# Patient Record
Sex: Female | Born: 1985 | Hispanic: Yes | Smoking: Never smoker
Health system: Southern US, Academic
[De-identification: ages and names within clinical notes are randomized; demographics above are authoritative.]

## PROBLEM LIST (undated history)

## (undated) DIAGNOSIS — A6920 Lyme disease, unspecified: Secondary | ICD-10-CM

## (undated) DIAGNOSIS — G510 Bell's palsy: Secondary | ICD-10-CM

## (undated) DIAGNOSIS — S022XXA Fracture of nasal bones, initial encounter for closed fracture: Secondary | ICD-10-CM

## (undated) DIAGNOSIS — Z8616 Personal history of COVID-19: Secondary | ICD-10-CM

## (undated) DIAGNOSIS — S6291XA Unspecified fracture of right wrist and hand, initial encounter for closed fracture: Secondary | ICD-10-CM

## (undated) DIAGNOSIS — E282 Polycystic ovarian syndrome: Secondary | ICD-10-CM

## (undated) HISTORY — DX: Personal history of COVID-19: Z86.16

## (undated) HISTORY — DX: Bell's palsy: G51.0

## (undated) HISTORY — DX: Lyme disease, unspecified: A69.20

---

## 2020-06-11 ENCOUNTER — Emergency Department (HOSPITAL_COMMUNITY): Payer: BC Managed Care – PPO

## 2020-06-11 ENCOUNTER — Observation Stay
Admission: EM | Admit: 2020-06-11 | Discharge: 2020-06-13 | Disposition: A | Discharge status: Home / Self Care | Payer: BC Managed Care – PPO | Attending: HOSPITALIST | Admitting: HOSPITALIST

## 2020-06-11 ENCOUNTER — Encounter (HOSPITAL_COMMUNITY): Payer: Self-pay

## 2020-06-11 ENCOUNTER — Other Ambulatory Visit: Payer: Self-pay

## 2020-06-11 DIAGNOSIS — R29810 Facial weakness: Secondary | ICD-10-CM

## 2020-06-11 DIAGNOSIS — G510 Bell's palsy: Secondary | ICD-10-CM | POA: Insufficient documentation

## 2020-06-11 DIAGNOSIS — I639 Cerebral infarction, unspecified: Secondary | ICD-10-CM

## 2020-06-11 DIAGNOSIS — G459 Transient cerebral ischemic attack, unspecified: Secondary | ICD-10-CM

## 2020-06-11 DIAGNOSIS — E785 Hyperlipidemia, unspecified: Secondary | ICD-10-CM | POA: Insufficient documentation

## 2020-06-11 DIAGNOSIS — E1165 Type 2 diabetes mellitus with hyperglycemia: Secondary | ICD-10-CM | POA: Insufficient documentation

## 2020-06-11 DIAGNOSIS — A6920 Lyme disease, unspecified: Secondary | ICD-10-CM | POA: Insufficient documentation

## 2020-06-11 DIAGNOSIS — Z79899 Other long term (current) drug therapy: Secondary | ICD-10-CM | POA: Insufficient documentation

## 2020-06-11 DIAGNOSIS — R000 Tachycardia, unspecified: Secondary | ICD-10-CM

## 2020-06-11 DIAGNOSIS — U071 COVID-19: Secondary | ICD-10-CM | POA: Insufficient documentation

## 2020-06-11 DIAGNOSIS — G0490 Encephalitis and encephalomyelitis, unspecified: Principal | ICD-10-CM | POA: Insufficient documentation

## 2020-06-11 HISTORY — DX: Polycystic ovarian syndrome: E28.2

## 2020-06-11 HISTORY — DX: COVID-19: U07.1

## 2020-06-11 HISTORY — DX: Unspecified fracture of right wrist and hand, initial encounter for closed fracture: S62.91XA

## 2020-06-11 HISTORY — DX: Fracture of nasal bones, initial encounter for closed fracture: S02.2XXA

## 2020-06-11 LAB — ECG 12 LEAD
Calculated R Axis: 53 degrees
PR Interval: 150 ms

## 2020-06-11 LAB — PT/INR
INR: 0.99 (ref 0.85–1.10)
PROTHROMBIN TIME: 10.5 seconds (ref 9.0–12.0)

## 2020-06-11 LAB — CBC WITH DIFF
BASOPHIL #: 0.2 10*3/uL (ref 0.00–0.20)
BASOPHIL %: 1 % (ref 0–2)
EOSINOPHIL #: 0.1 10*3/uL (ref 0.00–0.60)
EOSINOPHIL %: 1 % (ref 0–5)
HCT: 39.2 % (ref 36.0–48.0)
HGB: 13 g/dL (ref 11.6–14.8)
LYMPHOCYTE #: 5.7 10*3/uL — ABNORMAL HIGH (ref 1.10–3.80)
LYMPHOCYTE %: 32 % (ref 19–46)
MCH: 29.6 pg (ref 24.4–34.0)
MCHC: 33.1 g/dL (ref 30.0–37.0)
MCV: 89.3 fL — ABNORMAL HIGH (ref 79.0–88.0)
MONOCYTE #: 1.1 10*3/uL — ABNORMAL HIGH (ref 0.10–0.80)
MONOCYTE %: 6 % (ref 4–12)
MPV: 8.2 fL (ref 7.5–11.5)
NEUTROPHIL #: 10.6 10*3/uL — ABNORMAL HIGH (ref 1.80–7.50)
NEUTROPHIL %: 60 % (ref 41–69)
PLATELETS: 458 10*3/uL — ABNORMAL HIGH (ref 130–400)
RBC: 4.39 10*6/uL (ref 3.50–5.50)
RDW: 13.5 % (ref 11.5–14.0)
WBC: 17.8 10*3/uL — ABNORMAL HIGH (ref 4.5–11.5)

## 2020-06-11 LAB — COMPREHENSIVE METABOLIC PANEL, NON-FASTING
ALBUMIN/GLOBULIN RATIO: 1.1 — ABNORMAL LOW (ref 1.5–2.5)
ALBUMIN: 4.7 g/dL (ref 3.5–5.0)
ALKALINE PHOSPHATASE: 129 U/L — ABNORMAL HIGH (ref 38–126)
ALT (SGPT): 43 U/L — ABNORMAL HIGH (ref <35)
ANION GAP: 14 mmol/L (ref 5–19)
AST (SGOT): 38 U/L — ABNORMAL HIGH (ref 14–36)
BILIRUBIN TOTAL: 0.5 mg/dL (ref 0.2–1.3)
BUN/CREA RATIO: 33 — ABNORMAL HIGH (ref 6–20)
BUN: 13 mg/dL (ref 7–17)
CALCIUM: 9.6 mg/dL (ref 8.4–10.2)
CHLORIDE: 101 mmol/L (ref 98–107)
CO2 TOTAL: 23 mmol/L (ref 22–30)
CREATININE: 0.4 mg/dL — ABNORMAL LOW (ref 0.52–1.00)
GLUCOSE: 335 mg/dL — ABNORMAL HIGH (ref 74–106)
POTASSIUM: 3.8 mmol/L (ref 3.5–5.1)
PROTEIN TOTAL: 8.9 g/dL — ABNORMAL HIGH (ref 6.3–8.2)
SODIUM: 138 mmol/L (ref 137–145)

## 2020-06-11 LAB — C-REACTIVE PROTEIN(CRP),INFLAMMATION: CRP INFLAMMATION: 9 mg/L (ref <10.0)

## 2020-06-11 LAB — TROPONIN-I: TROPONIN I: <0.01 ng/mL (ref 0.00–0.03)

## 2020-06-11 LAB — POC BLOOD GLUCOSE (RESULTS): GLUCOSE, POC: 306 mg/dl — ABNORMAL HIGH (ref 74–106)

## 2020-06-11 LAB — HCG QUALITATIVE PREGNANCY, SERUM: PREGNANCY, SERUM QUALITATIVE: NEGATIVE

## 2020-06-11 LAB — SEDIMENTATION RATE: ERYTHROCYTE SEDIMENTATION RATE (ESR): 67 mm/hr — ABNORMAL HIGH (ref 0–15)

## 2020-06-11 MED ORDER — IOPAMIDOL 300 MG IODINE/ML (61 %) INTRAVENOUS SOLUTION
50.0000 mL | INTRAVENOUS | Status: AC
Start: 2020-06-11 — End: 2020-06-11
  Administered 2020-06-11: 23:00:00 50 mL via INTRAVENOUS

## 2020-06-11 MED ORDER — IOPAMIDOL 300 MG IODINE/ML (61 %) INTRAVENOUS SOLUTION
100.0000 mL | INTRAVENOUS | Status: AC
Start: 2020-06-11 — End: 2020-06-11
  Administered 2020-06-11: 100 mL via INTRAVENOUS

## 2020-06-11 NOTE — ED Provider Notes (Addendum)
[name]  ED Primary Provider Note  History of Present Illness   Chief Complaint   Patient presents with   . Facial Droop     Left sided      Arrival: The patient arrived by Car and is accompanied by partner  History Provided by: patient  History Limitations: none    Tammy Pham is a 35 y.o. female who had concerns including Facial Droop.  . 35 year old female with no PMH presents complaining of a 1 day hx of L facial numbness and weakness.  PT reports she first noticed these symptoms while eating dinner yesterday.  Approximately 30 minutes prior to presentation to the ED at approximately 2210 she also developed L arm paresthesias and L leg numbness and weakness.  Denies changes in speech, recent head injury, fevers, chills, vision changes, changes in hearing, or chest pain.  She does note that her L ear was "plugged" last week after which it "popped" and symptoms resolved.  She denies any recent insect bites.  Family hx of Bell's Palsy in grandmother.  Review of Systems   Review of Systems   Constitutional: Negative for chills and fever.   HENT: Negative for ear pain and tinnitus.    Eyes: Negative for visual disturbance.   Cardiovascular: Negative for chest pain.   Musculoskeletal: Negative for gait problem.   Neurological: Positive for facial asymmetry, weakness and numbness. Negative for dizziness, tremors, seizures, syncope, speech difficulty, light-headedness and headaches.        Paresthesias     Historical Data   Below pertinent information reviewed/confirmed with patient/caregiver and/or EMR:  Past Medical History:   Diagnosis Date   . Bell's palsy    . COVID-19 06/11/2020   . History of COVID-19    . Lyme disease    . Nasal fracture    . PCOS (polycystic ovarian syndrome)    . Right hand fracture     5th metacarpal fracture     Medications Prior to Admission     None        No Known Allergies  History reviewed. No pertinent surgical history.  Family Medical History:     Problem  Relation (Age of Onset)    Diabetes Mother, Maternal Grandmother        Social History     Tobacco Use   . Smoking status: Never Smoker   . Smokeless tobacco: Never Used   Vaping Use   . Vaping Use: Never used   Substance Use Topics   . Alcohol use: Yes     Comment: occasional   . Drug use: Never     Physical Exam   ED Triage Vitals [06/11/20 2249]   BP (Non-Invasive) (!) 156/106   Heart Rate (!) 109   Respiratory Rate 16   Temperature 37 C (98.6 F)   SpO2 99 %   Weight 86.2 kg (190 lb)   Height 1.549 m (5' 1" )     Physical Exam   Constitutional: 35 y.o. female appears  stated age in good health. Resting in bed in no acute distress  HENT:   Head: Normocephalic and atraumatic.   Mouth/Throat: Oropharynx is clear and moist.   Eyes: EOMI, PERRL , conjunctivae without discharge bilaterally  Neck: Trachea midline. Neck supple.  Cardiovascular: Regular rate and rhythm. No murmurs, rubs or gallops. Intact distal pulses.  Pulmonary/Chest: Breath sounds equal bilaterally. No respiratory distress. No wheezes, rales, or chest tenderness.   Abdominal: BS +.  Abdomen soft, nontender, no rebound or guarding.  Musculoskeletal: No edema, tenderness or deformity.  Skin: Warm and dry. No rash, erythema, pallor or cyanosis  Psychiatric: Normal mood and affect. Behavior is normal.   Neurological: Patient alert and responsive, Subjective decreased sensation L cheek and L leg, L sided facial droop that is not forehead sparing, L upper hemianopia, NIH 5    Patient Data   Labs Reviewed   SEDIMENTATION RATE - Abnormal; Notable for the following components:       Result Value    ERYTHROCYTE SEDIMENTATION RATE (ESR) 67 (*)     All other components within normal limits   COMPREHENSIVE METABOLIC PANEL, NON-FASTING - Abnormal; Notable for the following components:    CREATININE 0.40 (*)     BUN/CREA RATIO 33 (*)     GLUCOSE 335 (*)     ALKALINE PHOSPHATASE 129 (*)     ALT (SGPT) 43 (*)     AST (SGOT) 38 (*)     PROTEIN TOTAL 8.9 (*)      ALBUMIN/GLOBULIN RATIO 1.1 (*)     All other components within normal limits    Narrative:     HEM:29,ICT:<2,TURB:<20  Estimated Glomerular Filtration Rate (eGFR) calculated using the CKD-EPI (2009) equation, intended for patients 3 years of age and older. If race and/or gender is not documented or "unknown," there will be no eGFR calculation.   CBC WITH DIFF - Abnormal; Notable for the following components:    WBC 17.8 (*)     MCV 89.3 (*)     PLATELETS 458 (*)     NEUTROPHIL # 10.60 (*)     LYMPHOCYTE # 5.70 (*)     MONOCYTE # 1.10 (*)     All other components within normal limits   COVID-19 [place] MOLECULAR LAB TESTING - Abnormal; Notable for the following components:    SARS-CoV-2 Detected (*)     All other components within normal limits    Narrative:     Results are for the qualitative identification of SARS-CoV-2 (formerly 2019-nCoV) RNA. The SARS-CoV-2 RNA is generally detectable in nasopharyngeal swabs and nasal wash/aspirates during the ACUTE PHASE of infection. Hence, this test is intended to be performed on respiratory specimens collected from individuals who meet Centers for Disease Control and Prevention (CDC) clinical and/or epidemiological criteria for Coronavirus Disease 2019 (COVID-19) testing. CDC COVID-19 criteria for testing on human specimens are available at [facility] webpage information for Healthcare Professionals: Coronavirus Disease 2019 (COVID-19) ([email or web address]).    Disclaimer:  This assay has been authorized by FDA under an Emergency Use Authorization for use in laboratories certified under the Clinical Laboratory Improvement Amendments of 1988 (CLIA), 42 U.S.C. [phone], to perform high complexity tests. The impacts of vaccines, antiviral therapeutics, antibiotics, chemotherapeutic or immunosuppressant drugs have not been evaluated.    Test methodology:   Cepheid Xpert Xpress SARS-CoV-2 Assay real-time polymerase chain reaction (RT-PCR) test on the  GeneXpert Dx system.   POC BLOOD GLUCOSE (RESULTS) - Abnormal; Notable for the following components:    GLUCOSE, POC 306 (*)     All other components within normal limits   C-REACTIVE PROTEIN(CRP),INFLAMMATION - Normal    Narrative:     HEM:29,ICT:<2,TURB:<20   TROPONIN-I - Normal    Narrative:     HEM:29,ICT:<2,TURB:<20   PT/INR - Normal   CBC/DIFF    Narrative:     The following orders were created for panel order CBC/DIFF.  Procedure  Abnormality         Status                     ---------                               -----------         ------                     CBC WITH DIFF[[id number]]                Abnormal            Final result                 Please view results for these tests on the individual orders.   HCG QUALITATIVE PREGNANCY, SERUM   TYPE AND SCREEN     Imaging:  CT ANGIO INTRA-EXTRA CRANIAL W IV CONTRAST   Final Result by Edi, Radresults In (05/23 0020)   NO DISEASE OR STENOSIS INVOLVING THE CCAS, BIFURCATIONS OR ICAS BILATERALLY      VERTEBRAL ARTERIES ARE CODOMINANT WITHOUT ANY DISEASE OR STENOSIS      NO INTRACRANIAL VASCULAR IRREGULARITY, STENOSIS, THROMBUS OR OTHER ABNORMALITY                  One or more dose reduction techniques were used (e.g., Automated exposure control, adjustment of the mA and/or kV according to patient size, use of iterative reconstruction technique).         Radiologist location ID: [id number]         CT STROKE PERFUSION ONLY   Final Result by Edi, Radresults In (05/22 2331)   UNREMARKABLE EXAM                  One or more dose reduction techniques were used (e.g., Automated exposure control, adjustment of the mA and/or kV according to patient size, use of iterative reconstruction technique).         Radiologist location ID: [id number]         CT BRAIN WO IV CONTRAST   Final Result by Edi, Radresults In (05/22 2329)   NO ACUTE ABNORMALITY               One or more dose reduction techniques were used (e.g., Automated exposure control,  adjustment of the mA and/or kV according to patient size, use of iterative reconstruction technique).         Radiologist location ID: [id number]           EKG: Reviewed, see CVIS tests for interpretation.  Medical Decision Making   MDM/Course:  Meliah Appleman is a 35 y.o. female who had concerns including Facial Droop.   Given patients history, current symptoms, and exam; concern for, but not limited to, CVA, bell's palsy, vasculitis, demyelinating disorder.   Patient has a medium likelihood of decompensation/complication/mortality/morbidity due to their current condition.   Relevant/pertinent previous medical records reviewed via chart review activity and/or CareEverywhere activity.   LKW 2210, NIH 5.  BG on arrival elevated.  CT head and CT perfusion/CTA head and neck were performed upon arrival to the ED after which telestroke was contacted - physician recommended continuing with work up and close observation as he suspects a demyelinating disorder or vasculitis rather than CVA as the etiology of the patient's neurologic symptoms.  No findings of acute significance on imaging.  Labs remarkable for leukocytosis, thrombocytosis, and significant elevation in ESR.  COVID positive.   Telestroke updated and recommended waiting on steroids until MRI obtained.  Discussed results with the patient and recommended hospital admission for inpatient neurologic work up due to concern for COVID related encephalitis or vasculitis.  Pt understands and agrees to treatment plan.  Case was discussed with hospitalist who was agreeable to admitting.  Pt was admitted to the hospital in stable condition.         Medications Administered in the ED   iopamidol (ISOVUE-300) 61% infusion (50 mL Intravenous Given 06/11/20 2314)   iopamidol (ISOVUE-300) 61% infusion (100 mL Intravenous Given 06/11/20 2333)     Encounter Diagnoses   Name Primary?   . CVA (cerebral vascular accident) (CMS [place])    . TIA (transient ischemic attack)      Patient  will be admitted to the Medicine service for further workup and management.    Disposition: Admitted             Parts of this patients chart may have been completed in a retrospective fashion due to simultaneous direct patient care activities in the Emergency Department.   This note may have been partially generated using MModal Fluency Direct dictation system, and there may be some incorrect words, spellings, and/or punctuation that were not noted in checking the note before saving.      Dionne Ano, DO    I personally saw and examined the patient. I agree with the plan of care as documented. See the Fellow's note for full details.   Aundra Dubin, MD

## 2020-06-12 ENCOUNTER — Inpatient Hospital Stay (HOSPITAL_COMMUNITY): Payer: BC Managed Care – PPO

## 2020-06-12 ENCOUNTER — Encounter (HOSPITAL_COMMUNITY): Payer: Self-pay | Admitting: Family Medicine

## 2020-06-12 DIAGNOSIS — E119 Type 2 diabetes mellitus without complications: Secondary | ICD-10-CM

## 2020-06-12 DIAGNOSIS — R200 Anesthesia of skin: Secondary | ICD-10-CM

## 2020-06-12 DIAGNOSIS — R29810 Facial weakness: Secondary | ICD-10-CM

## 2020-06-12 DIAGNOSIS — E669 Obesity, unspecified: Secondary | ICD-10-CM

## 2020-06-12 DIAGNOSIS — U071 COVID-19: Secondary | ICD-10-CM

## 2020-06-12 DIAGNOSIS — R7401 Elevation of levels of liver transaminase levels: Secondary | ICD-10-CM

## 2020-06-12 DIAGNOSIS — D72829 Elevated white blood cell count, unspecified: Secondary | ICD-10-CM

## 2020-06-12 DIAGNOSIS — G0490 Encephalitis and encephalomyelitis, unspecified: Secondary | ICD-10-CM | POA: Diagnosis present

## 2020-06-12 DIAGNOSIS — R030 Elevated blood-pressure reading, without diagnosis of hypertension: Secondary | ICD-10-CM

## 2020-06-12 DIAGNOSIS — Z794 Long term (current) use of insulin: Secondary | ICD-10-CM

## 2020-06-12 LAB — ECG 12 LEAD
Calculated P Axis: 60 degrees
Calculated T Axis: 37 degrees
QRS Duration: 82 ms
QT Interval: 324 ms
QTC Calculation: 383 ms
Ventricular rate: 102 {beats}/min

## 2020-06-12 LAB — DRUG SCREEN, WITH CONFIRMATION, URINE
AMPHETAMINES URINE: NEGATIVE
BARBITURATES URINE: NEGATIVE
BENZODIAZEPINES URINE: NEGATIVE
BUPRENORPHINE URINE: NEGATIVE
CANNABINOIDS URINE: NEGATIVE
COCAINE METABOLITES URINE: NEGATIVE
FENTANYL, RANDOM URINE: NEGATIVE
METHADONE URINE: NEGATIVE
OPIATES URINE (LOW CUTOFF): NEGATIVE
OXYCODONE URINE: NEGATIVE
PCP URINE: NEGATIVE

## 2020-06-12 LAB — LIPID PANEL
CHOLESTEROL: 196 mg/dL (ref 0–200)
HDL CHOL: 37 mg/dL — ABNORMAL LOW (ref >40)
LDL CALC: 120 mg/dL — ABNORMAL HIGH (ref <100)
TRIGLYCERIDES: 194 mg/dL — ABNORMAL HIGH (ref <150)

## 2020-06-12 LAB — POC BLOOD GLUCOSE (RESULTS)
GLUCOSE, POC: 285 mg/dl — ABNORMAL HIGH (ref 74–106)
GLUCOSE, POC: 386 mg/dl — ABNORMAL HIGH (ref 74–106)
GLUCOSE, POC: 339 mg/dl — ABNORMAL HIGH (ref 74–106)
GLUCOSE, POC: 226 mg/dl — ABNORMAL HIGH (ref 74–106)

## 2020-06-12 LAB — COVID-19 ~~LOC~~ MOLECULAR LAB TESTING: SARS-CoV-2: DETECTED — AB

## 2020-06-12 LAB — LAVENDER TOP TUBE

## 2020-06-12 LAB — TYPE AND SCREEN
ABO/RH(D): O POS
ANTIBODY SCREEN: NEGATIVE

## 2020-06-12 LAB — HGA1C (HEMOGLOBIN A1C WITH EST AVG GLUCOSE)
ESTIMATED AVERAGE GLUCOSE: 229 mg/dL
HEMOGLOBIN A1C: 9.6 % — ABNORMAL HIGH (ref 4.0–6.0)

## 2020-06-12 LAB — MAGNESIUM: MAGNESIUM: 1.8 mg/dL (ref 1.6–2.3)

## 2020-06-12 LAB — PROCALCITONIN: PROCALCITONIN: 0.05 ng/mL (ref 0.00–0.08)

## 2020-06-12 LAB — BLUE TOP TUBE

## 2020-06-12 LAB — LIGHT GREEN TOP TUBE

## 2020-06-12 MED ORDER — INSULIN NPH ISOPHANE U-100 HUMAN 100 UNIT/ML SUBCUTANEOUS SUSP - CHARGE BY DOSE
8.0000 [IU] | Freq: Once | SUBCUTANEOUS | Status: AC
Start: 2020-06-12 — End: 2020-06-12
  Administered 2020-06-12: 8 [IU] via SUBCUTANEOUS
  Filled 2020-06-12: qty 8

## 2020-06-12 MED ORDER — GADOTERATE MEGLUMINE 0.5 MMOL/ML (376.9 MG/ML) INTRAVENOUS SOLUTION
20.0000 mL | INTRAVENOUS | Status: AC
Start: 2020-06-12 — End: 2020-06-12
  Administered 2020-06-12: 18 mL via INTRAVENOUS

## 2020-06-12 MED ORDER — SODIUM CHLORIDE 0.9 % (FLUSH) INJECTION SYRINGE
10.0000 mL | INJECTION | INTRAMUSCULAR | Status: DC | PRN
Start: 2020-06-12 — End: 2020-06-13

## 2020-06-12 MED ORDER — INSULIN LISPRO 100 UNIT/ML SUB-Q - CHARGE BY DOSE
10.0000 [IU] | Freq: Three times a day (TID) | SUBCUTANEOUS | Status: DC
Start: 2020-06-12 — End: 2020-06-13
  Administered 2020-06-12 – 2020-06-13 (×4): 10 [IU] via SUBCUTANEOUS
  Filled 2020-06-12 (×2): qty 1

## 2020-06-12 MED ORDER — INSULIN NPH ISOPHANE U-100 HUMAN 100 UNIT/ML SUBCUTANEOUS SUSP - CHARGE BY DOSE
8.0000 [IU] | Freq: Once | SUBCUTANEOUS | Status: DC
Start: 2020-06-12 — End: 2020-06-12

## 2020-06-12 MED ORDER — LISINOPRIL 10 MG TABLET
10.0000 mg | ORAL_TABLET | Freq: Every day | ORAL | Status: DC
Start: 2020-06-12 — End: 2020-06-13
  Administered 2020-06-12: 10 mg via ORAL
  Administered 2020-06-13: 0 mg via ORAL
  Filled 2020-06-12: qty 1

## 2020-06-12 MED ORDER — ATORVASTATIN 20 MG TABLET
20.0000 mg | ORAL_TABLET | Freq: Every evening | ORAL | Status: DC
Start: 2020-06-12 — End: 2020-06-13
  Administered 2020-06-12 (×2): 20 mg via ORAL
  Filled 2020-06-12: qty 1

## 2020-06-12 MED ORDER — DIPHENHYDRAMINE 50 MG/ML INJECTION SOLUTION
25.0000 mg | Freq: Once | INTRAMUSCULAR | Status: AC
Start: 2020-06-12 — End: 2020-06-12
  Administered 2020-06-12: 25 mg via INTRAVENOUS
  Filled 2020-06-12: qty 1

## 2020-06-12 MED ORDER — DOCUSATE SODIUM 100 MG CAPSULE
100.0000 mg | ORAL_CAPSULE | Freq: Two times a day (BID) | ORAL | Status: DC | PRN
Start: 2020-06-12 — End: 2020-06-13

## 2020-06-12 MED ORDER — LACTATED RINGERS INTRAVENOUS SOLUTION
INTRAVENOUS | Status: AC
Start: 2020-06-12 — End: 2020-06-13

## 2020-06-12 MED ORDER — LORAZEPAM 2 MG/ML INJECTION SOLUTION
0.5000 mg | Freq: Once | INTRAMUSCULAR | Status: DC
Start: 2020-06-12 — End: 2020-06-12

## 2020-06-12 MED ORDER — SODIUM CHLORIDE 0.9 % (FLUSH) INJECTION SYRINGE
10.0000 mL | INJECTION | Freq: Three times a day (TID) | INTRAMUSCULAR | Status: DC
Start: 2020-06-12 — End: 2020-06-13
  Administered 2020-06-12 (×2): 10 mL
  Administered 2020-06-12 – 2020-06-13 (×2): 0 mL

## 2020-06-12 MED ORDER — INSULIN LISPRO 100 UNIT/ML SUB-Q SSIP
0.0000 [IU] | INJECTION | Freq: Four times a day (QID) | SUBCUTANEOUS | Status: DC
Start: 2020-06-12 — End: 2020-06-13
  Administered 2020-06-12 (×2): 6 [IU] via SUBCUTANEOUS
  Administered 2020-06-12: 4 [IU] via SUBCUTANEOUS
  Administered 2020-06-12: 6 [IU] via SUBCUTANEOUS
  Administered 2020-06-13: 4 [IU] via SUBCUTANEOUS
  Administered 2020-06-13: 0 [IU] via SUBCUTANEOUS
  Filled 2020-06-12 (×2): qty 12

## 2020-06-12 MED ORDER — ONDANSETRON HCL (PF) 4 MG/2 ML INJECTION SOLUTION
4.0000 mg | Freq: Four times a day (QID) | INTRAMUSCULAR | Status: DC | PRN
Start: 2020-06-12 — End: 2020-06-13

## 2020-06-12 MED ORDER — PREDNISONE 20 MG TABLET
50.0000 mg | ORAL_TABLET | Freq: Every morning | ORAL | Status: DC
Start: 2020-06-13 — End: 2020-06-13
  Administered 2020-06-13: 50 mg via ORAL
  Filled 2020-06-12: qty 2

## 2020-06-12 MED ORDER — INSULIN GLARGINE-YFGN (U-100) 100 UNIT/ML SUBQ - CHARGE BY DOSE
10.0000 [IU] | Freq: Every day | SUBCUTANEOUS | Status: DC
Start: 2020-06-12 — End: 2020-06-12
  Administered 2020-06-12: 10 [IU] via SUBCUTANEOUS
  Filled 2020-06-12: qty 10

## 2020-06-12 MED ORDER — PREDNISONE 20 MG TABLET
60.0000 mg | ORAL_TABLET | Freq: Every morning | ORAL | Status: DC
Start: 2020-06-13 — End: 2020-06-12

## 2020-06-12 MED ORDER — METFORMIN ER 500 MG TABLET,EXTENDED RELEASE 24 HR
1000.0000 mg | ORAL_TABLET | Freq: Every evening | ORAL | Status: DC
Start: 2020-06-12 — End: 2020-06-13
  Administered 2020-06-12: 1000 mg via ORAL
  Filled 2020-06-12: qty 2

## 2020-06-12 MED ORDER — METHYLPREDNISOLONE SOD SUCCINATE 40 MG/ML SOLUTION FOR INJ. WRAPPER
40.0000 mg | Freq: Two times a day (BID) | INTRAMUSCULAR | Status: DC
Start: 2020-06-12 — End: 2020-06-12
  Administered 2020-06-12 (×2): 40 mg via INTRAVENOUS
  Filled 2020-06-12: qty 1

## 2020-06-12 MED ORDER — INSULIN GLARGINE-YFGN (U-100) 100 UNIT/ML SUBQ - CHARGE BY DOSE
20.0000 [IU] | Freq: Every evening | SUBCUTANEOUS | Status: DC
Start: 2020-06-13 — End: 2020-06-13

## 2020-06-12 MED ORDER — ACETAMINOPHEN 325 MG TABLET
650.0000 mg | ORAL_TABLET | ORAL | Status: DC | PRN
Start: 2020-06-12 — End: 2020-06-13
  Administered 2020-06-13: 650 mg via ORAL
  Filled 2020-06-12: qty 2

## 2020-06-12 MED ORDER — VALACYCLOVIR 500 MG TABLET
1000.0000 mg | ORAL_TABLET | Freq: Every day | ORAL | Status: DC
Start: 2020-06-12 — End: 2020-06-13
  Administered 2020-06-12 – 2020-06-13 (×3): 1000 mg via ORAL
  Filled 2020-06-12 (×2): qty 2

## 2020-06-12 NOTE — Nurses Notes (Signed)
Pt rang out to report that she is getting hives on her cheeks and has a tingling sensation in her face. Dr. Lawson Fiscal made aware that pt just received her first dose of Solumedrol which could be the cause. Benedryl 25 mg IV x 1 ordered.

## 2020-06-12 NOTE — H&P (Addendum)
[facility]   History and Physical      CHIEF COMPLAINT: facial droop and numbness    HISTORY OF PRESENT ILLNESS:  35 y.o. female with medical history consisting of, but not limited to, PCOS who recently moved here from [place] temporarily, c/oleft facial numbness. Pt states about a week ago while in [place] she was in the shower when she started experiencing significant hearing loss in the left eye after " her ear drum popped". She has since regained her hearing in that ear. On Friday she woke up with headaches that also went away. On Saturday Pt states she was drinking out of a straw, but could not form a proper seal to allow suctioning, later that day she couldn't feel her mouth, couldn't chew properly, and drooled a bit. She looked at herself in the mirror and noticed lower facial asymmetry. She could not close her left eye. She had some difficulty seeing on the left side of her lateral visual field. Yesterday she also noticed mild numbness in the left upper extremity.    Denies fever, chills, dyspnea, rash, discolored urine, or other motor or sensory symptoms.    Teleneurology from [facility] was consulted and they did not feel that it was a stroke or Bell's palsy. Instead they thought it could be due to a vasculitis of some form. They recommended CVA workup anyway.    PMHx:    Past Medical History:   Diagnosis Date   . Nasal fracture    . PCOS (polycystic ovarian syndrome)    . Right hand fracture     5th metacarpal fracture     PSHx:   none   Allergies:    No Known Allergies Social History  Social History     Tobacco Use   . Smoking status: Never Smoker   . Smokeless tobacco: Never Used   Vaping Use   . Vaping Use: Never used   Substance Use Topics   . Drug use: Never       Family History  Family Medical History:     Problem Relation (Age of Onset)    Diabetes Mother, Maternal Grandmother               REVIEW OF SYSTEMS:  All systems were reviewed with patient and found to be negative, except for what has  been showed in the HPI.    MEDICATIONS:  No current outpatient medications    Medications Administered in the ED   iopamidol (ISOVUE-300) 61% infusion (50 mL Intravenous Given 06/11/20 2314)   iopamidol (ISOVUE-300) 61% infusion (100 mL Intravenous Given 06/11/20 2333)       PHYSICAL EXAMINATION:  Vital signs reviewed.  Gen : Not in acute distress.obese  Heent: NC, AT.   Neck: supple, No JVD.  No carotid bruit.  Heart : S1 S2 auscultated, mild tachycardia 106 to 110. no murmur  Lungs: No ronchi, rales, wheezes, or crackles. On room air.  Abdomen: soft, non distended, nontender, normoactive bowels  Neuro: AAO X 3. No gross motor deficit. PERRLA, EOMI. I did not detect any visual file deficit. 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 Pt could not fully close left eye. No winkled noted on left forehead with eyebrows raising.. mild left UE paresthesia. No nystagmus, no babinski. Normal cerebellar function testing. Weaker left hand grip ( pt states that's chronic since a right hand fracture a while ago).  Musculoskeletal: FROM, no chest wall tenderness. No homan's.  EXT: no clubbing, or cyanosis .  No edema .    Skin: no rash. no jaundice. no pallor  Psych: normal affect, congruent mood.    Labs, Imaging studies, and other diagnostic data reviewed.  Results for orders placed or performed during the hospital encounter of 06/11/20 (from the past 24 hour(s))   ECG 12 LEAD   Result Value Ref Range    Ventricular rate 102 BPM    PR Interval 150 ms    QRS Duration 82 ms    QT Interval 324 ms    QTC Calculation 383 ms    Calculated P Axis 60 degrees    Calculated R Axis 53 degrees    Calculated T Axis 37 degrees   POC BLOOD GLUCOSE (RESULTS)   Result Value Ref Range    GLUCOSE, POC 306 (H) 74 - 106 mg/dl   SEDIMENTATION RATE   Result Value Ref Range    ERYTHROCYTE SEDIMENTATION RATE (ESR) 67 (H) 0 - 15 mm/hr   C-REACTIVE PROTEIN(CRP),INFLAMMATION   Result Value Ref Range    CRP INFLAMMATION 9.0 <10.0 mg/L   COMPREHENSIVE  METABOLIC PANEL, NON-FASTING   Result Value Ref Range    SODIUM 138 137 - 145 mmol/L    POTASSIUM 3.8 3.5 - 5.1 mmol/L    CHLORIDE 101 98 - 107 mmol/L    CO2 TOTAL 23 22 - 30 mmol/L    ANION GAP 14 5 - 19 mmol/L    BUN 13 7 - 17 mg/dL    CREATININE 0.40 (L) 0.52 - 1.00 mg/dL    BUN/CREA RATIO 33 (H) 6 - 20    ESTIMATED GFR      ALBUMIN 4.7 3.5 - 5.0 g/dL    CALCIUM 9.6 8.4 - 10.2 mg/dL    GLUCOSE 335 (H) 74 - 106 mg/dL    ALKALINE PHOSPHATASE 129 (H) 38 - 126 U/L    ALT (SGPT) 43 (H) <35 U/L    AST (SGOT) 38 (H) 14 - 36 U/L    BILIRUBIN TOTAL 0.5 0.2 - 1.3 mg/dL    PROTEIN TOTAL 8.9 (H) 6.3 - 8.2 g/dL    ALBUMIN/GLOBULIN RATIO 1.1 (L) 1.5 - 2.5   HCG QUALITATIVE PREGNANCY, SERUM   Result Value Ref Range    PREGNANCY, SERUM QUALITATIVE Negative    TROPONIN-I   Result Value Ref Range    TROPONIN I <0.01 0.00 - 0.03 ng/mL   CBC WITH DIFF   Result Value Ref Range    WBC 17.8 (H) 4.5 - 11.5 x10^3/uL    RBC 4.39 3.50 - 5.50 x10^6/uL    HGB 13.0 11.6 - 14.8 g/dL    HCT 39.2 36.0 - 48.0 %    MCV 89.3 (H) 79.0 - 88.0 fL    MCH 29.6 24.4 - 34.0 pg    MCHC 33.1 30.0 - 37.0 g/dL    RDW 13.5 11.5 - 14.0 %    PLATELETS 458 (H) 130 - 400 x10^3/uL    MPV 8.2 7.5 - 11.5 fL    NEUTROPHIL % 60 41 - 69 %    LYMPHOCYTE % 32 19 - 46 %    MONOCYTE % 6 4 - 12 %    EOSINOPHIL % 1 0 - 5 %    BASOPHIL % 1 0 - 2 %    NEUTROPHIL # 10.60 (H) 1.80 - 7.50 x10^3/uL    LYMPHOCYTE # 5.70 (H) 1.10 - 3.80 x10^3/uL    MONOCYTE # 1.10 (H) 0.10 - 0.80 x10^3/uL    EOSINOPHIL # 0.10  0.00 - 0.60 x10^3/uL    BASOPHIL # 0.20 0.00 - 0.20 x10^3/uL   PT/INR   Result Value Ref Range    PROTHROMBIN TIME 10.5 9.0 - 12.0 seconds    INR 0.99 0.85 - 1.10   TYPE AND SCREEN   Result Value Ref Range    ABO/RH(D) O Rh Positive     ANTIBODY SCREEN Negative    COVID-19 - SCREENING - Admission (NON-PUI)   Result Value Ref Range    SARS-CoV-2 Detected (A) Not Detected     CT ANGIO INTRA-EXTRA CRANIAL W IV CONTRAST   Final Result by Edi, Radresults In (05/23 0020)   NO DISEASE  OR STENOSIS INVOLVING THE CCAS, BIFURCATIONS OR ICAS BILATERALLY      VERTEBRAL ARTERIES ARE CODOMINANT WITHOUT ANY DISEASE OR STENOSIS      NO INTRACRANIAL VASCULAR IRREGULARITY, STENOSIS, THROMBUS OR OTHER ABNORMALITY                  One or more dose reduction techniques were used (e.g., Automated exposure control, adjustment of the mA and/or kV according to patient size, use of iterative reconstruction technique).         Radiologist location ID: [id number]         CT STROKE PERFUSION ONLY   Final Result by Edi, Radresults In (05/22 2331)   UNREMARKABLE EXAM                  One or more dose reduction techniques were used (e.g., Automated exposure control, adjustment of the mA and/or kV according to patient size, use of iterative reconstruction technique).         Radiologist location ID: [id number]         CT BRAIN WO IV CONTRAST   Final Result by Edi, Radresults In (05/22 2329)   NO ACUTE ABNORMALITY               One or more dose reduction techniques were used (e.g., Automated exposure control, adjustment of the mA and/or kV according to patient size, use of iterative reconstruction technique).         Radiologist location ID: [id number]              ASSESSMENT AND PLAN:  This is a 35 y.o. female with above Hx, physical exam, labs, and imaging findings, now with:  1.Stroke-like symptoms  2. Suspected Bell's palsy  3. Newly diagnosed diabetes, presumed type 2  4. Accelerated BP  5. Covid 19 infection  6. Leucocytosis  7. Mild transaminitis likely 2ry to covid-19  8. Class 2 obesity    Tele.  covid isolation.  No indication for covid treatment at this time, as pt is essentially asymptomatic.  Pt is unsure when she contracted covid.  Covid 19 has been reported to cause bell's palsy. Diabetes as well is a high risk factor.  Parts of pt's exam suggest bell's palsy, other do not.  Solumedrol 40 mg IV Q 12H.  Neuro Workup so far negative  ll obtain MR brain , TTE, carotid US.  Neuro, Dr. Carmon Ginsberg consulted.  Insulin  sliding scale started. ll need metformin on discharge. Hemogloblin A1C ordered.  ll start lisinopril 10 mg daily  DVT prophylaxis: SCDs    CODE STATUS: full code    Advance care planning time spent: 2 minutes    All questions from the patient were addressed to patient's satisfaction.  Further adjustment to the treatment plan will depend on progress, results of pending  investigations and recommendations from consultants on the case.    This note was dictated with the help of voice recognition software.  Should there be any information in the note that does not appear to make sense, please contact me as soon as possible for clarification or rectification.    Martyn Ehrich, MD  06/12/2020, 02:02

## 2020-06-12 NOTE — Care Plan (Signed)
[facility]  Care Plan Note    Situation: Pt adm on 5/22 with c/o 1 day hx of left facial numbess and weakness in left arm and leg. CT brain negative. + Covid.     Intervention: Pt with no covid sx. Left facial numbness continues. Left arm and left leg numbness improved.     Response: For MRI brain, ECHO, and carotid dopplers in am.       Arianie Couse, RN

## 2020-06-12 NOTE — Care Plan (Addendum)
Patient was seen and examined.    Bell's palsy    Still with left facial palsy and left upper extremity numbness and tingling.  MRI brain without any evidence of acute stroke or other pathology.  Recommended MRI C-spine for her left upper extremity symptoms however patient declined  Blood work with ANCA, ANA sent  Started on steroids and valacyclovir    New onset diabetes mellitus/hyperglycemia    Hyperglycemia worsened with steroids  HbA1c 9.6  Started on insulin regimen  Diabetic educator    Hyperlipidemia - started on Lipitor    COVID infection - patient is asymptomatic.    Isolation till at least 5 days after the first positive test.  Wear a properly well-fitted mask around others for 5 more days after the 5-day isolation period.    Significant other to quarantine 10 days after last close contact.    Plan of care updated with patient, significant other and nursing staff.    Talulah Schirmer Leland Johns MD

## 2020-06-12 NOTE — Care Plan (Signed)
Problem: Adult Inpatient Plan of Care  Goal: Patient-Specific Goal (Individualized)  Outcome: Ongoing (see interventions/notes)  Flowsheets (Taken 06/12/2020 0818)  Individualized Care Needs: none  Patient-Specific Goals (Include Timeframe): resolution of numbness     Problem: Adult Inpatient Plan of Care  Goal: Absence of Hospital-Acquired Illness or Injury  Intervention: Identify and Manage Fall Risk  Recent Flowsheet Documentation  Taken 06/12/2020 0818 by Truett Perna, RN  Safety Promotion/Fall Prevention:   fall prevention program maintained   nonskid shoes/slippers when out of bed   safety round/check completed   activity supervised     Truett Perna, RN  06/12/2020, 16:57

## 2020-06-12 NOTE — Nurses Notes (Signed)
Received consult to see patient for new onset diabetes. Tammy Pham stats that she was just informed of her diagnosis this admission. She expresses a desire to go home. She does not have insurance or a PCP.     Discussed diabetes diagnosis including A1C. Stressed the importance of diet, exercise, checking glucose, taking medications as prescribed, and following up with a PCP.     Discussed Metformin including cost. Patient stated that she has the [facility] app if needed.     All questions answered. Book and handouts given to [employer].     Lab Results   Component Value Date    GLUCOSENF 335 (H) 06/11/2020     Lab Results   Component Value Date    GLUIP 386 (H) 06/12/2020     Lab Results   Component Value Date    HA1C 9.6 (H) 06/11/2020   Teressa Lower, CDE  06/12/2020, 14:29

## 2020-06-12 NOTE — Care Management Notes (Addendum)
[facility]  Care Management Initial Evaluation    Patient Name: Tammy Pham  Date of Birth: August 08, 1985  Sex: female  Date/Time of Admission: 06/11/2020 10:41 PM  Room/Bed: 556/A  Payor: /   Primary Care Providers:  Pcp, No (General)    Pharmacy Info:   Preferred Pharmacy       None          Emergency Contact Info:   Extended Emergency Contact Information  Primary Emergency ContactDelray Pham  Mobile Phone: [phone]  Relation: Significant other  Interpreter needed? No     06/12/20 1402   Assessment Details   Date of Care Management Update 06/12/20   Date of Next DCP Update 06/14/20   Readmission   Is this a readmission? No   Insurance Information/Type   Insurance type Other (see comments)   Employment/Financial   Financial Concerns no insurance coverage   Living Environment   Lives With alone   Living Arrangements apartment   Able to Return to Prior Arrangements yes   Living Arrangement Comments Patient from [place]- at boyfreind[facility] house [place] [facility] [place]   Home Assessment: No Problems Identified   Home Accessibility no concerns   Care Management Plan   Discharge Planning Status initial meeting   Projected Discharge Date 06/13/20   Discharge plan discussed with: Patient;Other (Comment)   Discharge Needs Assessment   Equipment Currently Used at Home none   Equipment Needed After Discharge glucometer   Discharge Facility/Level of Care Needs Home (Patient/Family Member/other)(code 1)   Transportation Available car   Referral Information   Admission Type inpatient   Arrived From home or self-care     History:   Tammy Pham is a 35 y.o., female, admitted with c/o numbness on 052222.    Height/Weight: 154.9 cm (5\' 1" ) / 86.5 kg (190 lb 11.2 oz)     LOS: 0 days   Admitting Diagnosis: Encephalitis [G04.90]    Assessment:   Covid positive. I spoke with patient. She is a self pay- works but no insurance with her job until November. December notified in financials to assist patient. She also  has no family MD Tammy Pham notified to assist with this. Not sure if she is staying at her boyfriends or going home back to [facility] where she is from. Given information for [facility] for assistance with medication, MD care if she qualifies. I phoned [facility]- the patient has to phone them herself and make the arrangements after discharge. I gave her [facility]'s phone number.  She was diagnosed as a new diabetic BS today =[phone]. Consult today with Diabetic Educator. She is also going to use [facility].    Discharge Plan:  Home (Patient/Family Member/other) (code 1)  Patient is independent - home plan    The patient will continue to be evaluated for developing discharge needs.     Case Manager: [facility], RN

## 2020-06-12 NOTE — Consults (Signed)
[facility]   Neurology Initial Consult    Tammy Pham, 35 y.o. female  Date of Admission:  06/11/2020  Date of Birth:  1985/05/29        Chief Complaint:  Left facial droop.     HPI:      patient is a pleasant 35 y.o. female  who presented with acute left-sided facial droop.  Patient has history of PCOS, apparently takes no medications in the outpatient setting.  She about a week ago she felt her left ear "popped" and she lost significant amount of hearing in the left ear for the next 4 days and subsequently recovered.   On Friday 3 days ago she had mild headache that resolved later. On Saturday she noted she was not able to seal her lips properly around the straw possibly she was having some left facial droop weakness.  This has continued and yesterday she noticed some burning sensation on her left eye apparently she was not able to close her left eyelid is completely and not blinking as effectively on the left side.  She denies any further hearing loss.  Yesterday when she was driving from [place] she noted her left arm became tingly and then resolved without weakness.  Denies having any weakness no speech problems no visual changes or balance problems.  No vertigo nausea or vomiting.  Still having very mild left ear pain but no discharge or rash.  She never had similar symptoms like this in the past.      I reviewed head CT and MRI of the brain that showed no significant abnormalities.  Labs were significant for A1c is 9 and now diagnosed to have diabetes.  WBC is 17000. ESR elevated at 67. She received single dose of Solu-Medrol 40 mg apparently developed rash and received Benadryl.  She tested positive for COVID-19 although she has no typical symptoms.        Past Medical History:   Diagnosis Date   . COVID-19 06/11/2020   . Nasal fracture    . PCOS (polycystic ovarian syndrome)    . Right hand fracture     5th metacarpal fracture             Medications Prior to Admission     None        No  Known Allergies  Social History     Tobacco Use   . Smoking status: Never Smoker   . Smokeless tobacco: Never Used   Substance Use Topics   . Alcohol use: Not on file     Comment: occasional     Family Medical History:     Problem Relation (Age of Onset)    Diabetes Mother, Maternal Grandmother          Current Facility-Administered Medications:   .  acetaminophen (TYLENOL) tablet, 650 mg, Oral, Q4H PRN, Pluviose, Otho Ket, MD  .  docusate sodium (COLACE) capsule, 100 mg, Oral, 2x/day PRN, Pluviose, Otho Ket, MD  .  insulin glargine-yfgn 100 units/mL injection, 10 Units, Subcutaneous, Daily, Villgran, Vipin, MD, 10 Units at 06/12/20 1042  .  [Held by provider] lisinopril (PRINIVIL) tablet, 10 mg, Oral, Daily, Pluviose, Otho Ket, MD, 10 mg at 06/12/20 0818  .  methylPREDNISolone sod succ (SOLU-MEDROL) 40 mg/mL injection, 40 mg, Intravenous, Q12H, Pluviose, Otho Ket, MD, 40 mg at 06/12/20 0557  .  NS flush syringe, 10 mL, Intracatheter, Q8HRS, Pluviose, Otho Ket, MD, 10 mL at 06/12/20 0558  .  NS  flush syringe, 10 mL, Intracatheter, Q1H PRN, Pluviose, Otho Ket, MD  .  ondansetron (ZOFRAN) 2 mg/mL injection, 4 mg, Intravenous, Q6H PRN, Pluviose, Otho Ket, MD  .  Genella Mech insulin lispro 100 units/mL injection, 0-12 Units, Subcutaneous, 4x/day AC, 6 Units at 06/12/20 0817 **AND** POCT WHOLE BLOOD GLUCOSE, , , TID AC & HS, Pluviose, Otho Ket, MD    ROS: Other than ROS in the HPI, all other systems were negative.    Exam:  Temperature: 36.6 C (97.9 F)  Heart Rate: (!) 110  BP (Non-Invasive): 117/65  Respiratory Rate: 20  SpO2: 96 %  General: appears in good health and vital signs reviewed  Neck: supple, symmetrical, trachea midline  Carotids:Carotids normal without bruit  Lungs: Clear to auscultation bilaterally.   Cardiovascular: regular rate and rhythm  Abdomen: Soft, non-tender  Extremities: No cyanosis or edema  Mental status:  Level of Consciousness: alert  Orientations: Alert and oriented x 3  MemoryRegistration,  Recall, and Following of commands is normal  AttentionsAttention and Concentration are normal  Knowledge: Good and Consistent with education  Language: Normal  Speech: Normal  Cranial nerves:   CN2: visual field normal  CN 3,4,6: EOMI, PERRLA  CN 5Facial sensation intact  CN 7 patient has mild left sided peripheral facial weakness involving proportionally the upper as well as lower parts of the left side of the face  CN 8: Hearing grossly intact  CN 9,10: Palate symmetric and gag normal  CN 11: Sternocleidomastoid and Trapezius have normal strength.  CN 12: Tongue normal with no fasiculations or deviation    Muscle tone: WNL.  Strength is 5/5 in all extremities.  Sensation symmetric to light touch.    Normal color, texture and turgor without significant lesions or rashes         Labs:      Results for orders placed or performed during the hospital encounter of 06/11/20 (from the past 24 hour(s))   SEDIMENTATION RATE   Result Value Ref Range    ERYTHROCYTE SEDIMENTATION RATE (ESR) 67 (H) 0 - 15 mm/hr   C-REACTIVE PROTEIN(CRP),INFLAMMATION   Result Value Ref Range    CRP INFLAMMATION 9.0 <10.0 mg/L    Narrative    HEM:29,ICT:<2,TURB:<20   CBC/DIFF    Narrative    The following orders were created for panel order CBC/DIFF.  Procedure                               Abnormality         Status                     ---------                               -----------         ------                     CBC WITH DIFF[[id number]]                Abnormal            Final result                 Please view results for these tests on the individual orders.   COMPREHENSIVE METABOLIC PANEL, NON-FASTING   Result Value Ref Range    SODIUM 138  137 - 145 mmol/L    POTASSIUM 3.8 3.5 - 5.1 mmol/L    CHLORIDE 101 98 - 107 mmol/L    CO2 TOTAL 23 22 - 30 mmol/L    ANION GAP 14 5 - 19 mmol/L    BUN 13 7 - 17 mg/dL    CREATININE 0.40 (L) 0.52 - 1.00 mg/dL    BUN/CREA RATIO 33 (H) 6 - 20    ESTIMATED GFR      ALBUMIN 4.7 3.5 - 5.0 g/dL    CALCIUM  9.6 8.4 - 10.2 mg/dL    GLUCOSE 335 (H) 74 - 106 mg/dL    ALKALINE PHOSPHATASE 129 (H) 38 - 126 U/L    ALT (SGPT) 43 (H) <35 U/L    AST (SGOT) 38 (H) 14 - 36 U/L    BILIRUBIN TOTAL 0.5 0.2 - 1.3 mg/dL    PROTEIN TOTAL 8.9 (H) 6.3 - 8.2 g/dL    ALBUMIN/GLOBULIN RATIO 1.1 (L) 1.5 - 2.5    Narrative    HEM:29,ICT:<2,TURB:<20  Estimated Glomerular Filtration Rate (eGFR) calculated using the CKD-EPI (2009) equation, intended for patients 65 years of age and older. If race and/or gender is not documented or "unknown," there will be no eGFR calculation.   HCG QUALITATIVE PREGNANCY, SERUM   Result Value Ref Range    PREGNANCY, SERUM QUALITATIVE Negative    TROPONIN-I   Result Value Ref Range    TROPONIN I <0.01 0.00 - 0.03 ng/mL    Narrative    HEM:29,ICT:<2,TURB:<20   PT/INR   Result Value Ref Range    PROTHROMBIN TIME 10.5 9.0 - 12.0 seconds    INR 0.99 0.85 - 1.10   CBC WITH DIFF   Result Value Ref Range    WBC 17.8 (H) 4.5 - 11.5 x10^3/uL    RBC 4.39 3.50 - 5.50 x10^6/uL    HGB 13.0 11.6 - 14.8 g/dL    HCT 39.2 36.0 - 48.0 %    MCV 89.3 (H) 79.0 - 88.0 fL    MCH 29.6 24.4 - 34.0 pg    MCHC 33.1 30.0 - 37.0 g/dL    RDW 13.5 11.5 - 14.0 %    PLATELETS 458 (H) 130 - 400 x10^3/uL    MPV 8.2 7.5 - 11.5 fL    NEUTROPHIL % 60 41 - 69 %    LYMPHOCYTE % 32 19 - 46 %    MONOCYTE % 6 4 - 12 %    EOSINOPHIL % 1 0 - 5 %    BASOPHIL % 1 0 - 2 %    NEUTROPHIL # 10.60 (H) 1.80 - 7.50 x10^3/uL    LYMPHOCYTE # 5.70 (H) 1.10 - 3.80 x10^3/uL    MONOCYTE # 1.10 (H) 0.10 - 0.80 x10^3/uL    EOSINOPHIL # 0.10 0.00 - 0.60 x10^3/uL    BASOPHIL # 0.20 0.00 - 0.20 x10^3/uL   EXTRA TUBES    Narrative    The following orders were created for panel order EXTRA TUBES.  Procedure                               Abnormality         Status                     ---------                               -----------         ------  LIGHT GREEN TOP YBOF[[id number]]                             Final result                 Please view results for  these tests on the individual orders.   LIGHT GREEN TOP TUBE   Result Value Ref Range    RAINBOW/EXTRA TUBE AUTO RESULT Yes    COVID-19 - SCREENING - Admission (NON-PUI)   Result Value Ref Range    SARS-CoV-2 Detected (A) Not Detected    Narrative    Results are for the qualitative identification of SARS-CoV-2 (formerly 2019-nCoV) RNA. The SARS-CoV-2 RNA is generally detectable in nasopharyngeal swabs and nasal wash/aspirates during the ACUTE PHASE of infection. Hence, this test is intended to be performed on respiratory specimens collected from individuals who meet Centers for Disease Control and Prevention (CDC) clinical and/or epidemiological criteria for Coronavirus Disease 2019 (COVID-19) testing. CDC COVID-19 criteria for testing on human specimens are available at [facility] webpage information for Healthcare Professionals: Coronavirus Disease 2019 (COVID-19) ([email or web address]).    Disclaimer:  This assay has been authorized by FDA under an Emergency Use Authorization for use in laboratories certified under the Clinical Laboratory Improvement Amendments of 1988 (CLIA), 42 U.S.C. [phone], to perform high complexity tests. The impacts of vaccines, antiviral therapeutics, antibiotics, chemotherapeutic or immunosuppressant drugs have not been evaluated.    Test methodology:   Cepheid Xpert Xpress SARS-CoV-2 Assay real-time polymerase chain reaction (RT-PCR) test on the GeneXpert Dx system.   DRUG SCREEN, WITH CONFIRMATION, URINE   Result Value Ref Range    AMPHETAMINES URINE Negative Negative    BARBITURATES URINE Negative Negative    BENZODIAZEPINES URINE Negative Negative    BUPRENORPHINE URINE Negative Negative    COCAINE METABOLITES URINE Negative Negative    FENTANYL, RANDOM URINE Negative Negative    METHADONE URINE Negative Negative    OPIATES URINE (LOW CUTOFF) Negative Negative    OXYCODONE URINE Negative Negative    PCP URINE Negative Negative    CANNABINOIDS URINE  Negative Negative    Narrative    The results of this drug screen are intended for use in medical applications only. They are not intended or suitable for use in forensic applications. Other related areas such as pre-employment require a confirmatory test if positive.  Results are reported as positive if they are above the following concentrations:                                       Amphetamines                  1000 ng/mL            Barbiturates                   300 ng/ml            Benzodiazepines                300 ng/mL            Buprenorphine                    5 ng/mL            Cocaine (Benzoylecgonine)      300 ng/mL  Fentanyl                         1 ng/mL            Methadone                      300 ng/mL            Opiates                        300 ng/mL            Oxycodone                      100 ng/mL            PCP                             25 ng/mL            THC                             50 ng/mL   HGA1C (HEMOGLOBIN A1C WITH EST AVG GLUCOSE)   Result Value Ref Range    HEMOGLOBIN A1C 9.6 (H) 4.0 - 6.0 %    ESTIMATED AVERAGE GLUCOSE 229 mg/dL   MAGNESIUM - AM ONCE   Result Value Ref Range    MAGNESIUM 1.8 1.6 - 2.3 mg/dL    Narrative    HEM:<15,ICT:<2,TURB:<20   LIPID PANEL   Result Value Ref Range    CHOLESTEROL 196 0 - 200 mg/dL    TRIGLYCERIDES 194 (H) <150 mg/dL    HDL CHOL 37 (L) >40 mg/dL    LDL CALC 120 (H) <100 mg/dL    Narrative    HEM:<15,ICT:<2,TURB:<20   EXTRA TUBES    Narrative    The following orders were created for panel order EXTRA TUBES.  Procedure                               Abnormality         Status                     ---------                               -----------         ------                     BLUE TOP ONGE[[id number]]                                    Final result               LAVENDER TOP MWNU[[id number]]                                Final result                 Please view results for these tests on the individual orders.   BLUE TOP TUBE  Result Value Ref Range    RAINBOW/EXTRA TUBE AUTO RESULT Yes    LAVENDER TOP TUBE   Result Value Ref Range    RAINBOW/EXTRA TUBE AUTO RESULT Yes    PROCALCITONIN   Result Value Ref Range    PROCALCITONIN  0.05 0.00 - 0.08 ng/mL    Narrative    HEM:<15,ICT:<2,TURB:<20   TYPE AND SCREEN   Result Value Ref Range    ABO/RH(D) O Rh Positive     ANTIBODY SCREEN Negative    POC BLOOD GLUCOSE (RESULTS)   Result Value Ref Range    GLUCOSE, POC 306 (H) 74 - 106 mg/dl   POC BLOOD GLUCOSE (RESULTS)   Result Value Ref Range    GLUCOSE, POC 285 (H) 74 - 106 mg/dl          Radiology:   MRI BRAIN W/WO CONTRAST   Final Result   NORMAL BRAIN MRI WITHOUT AND WITH CONTRAST.         INCIDENTALLY THE PONTINE TONSILS ARE ENLARGED         Radiologist location ID: [id number]         CT ANGIO INTRA-EXTRA CRANIAL W IV CONTRAST   Final Result   NO DISEASE OR STENOSIS INVOLVING THE CCAS, BIFURCATIONS OR ICAS BILATERALLY      VERTEBRAL ARTERIES ARE CODOMINANT WITHOUT ANY DISEASE OR STENOSIS      NO INTRACRANIAL VASCULAR IRREGULARITY, STENOSIS, THROMBUS OR OTHER ABNORMALITY                  One or more dose reduction techniques were used (e.g., Automated exposure control, adjustment of the mA and/or kV according to patient size, use of iterative reconstruction technique).         Radiologist location ID: [id number]         CT STROKE PERFUSION ONLY   Final Result   UNREMARKABLE EXAM                  One or more dose reduction techniques were used (e.g., Automated exposure control, adjustment of the mA and/or kV according to patient size, use of iterative reconstruction technique).         Radiologist location ID: [id number]         CT BRAIN WO IV CONTRAST   Final Result   NO ACUTE ABNORMALITY               One or more dose reduction techniques were used (e.g., Automated exposure control, adjustment of the mA and/or kV according to patient size, use of iterative reconstruction technique).         Radiologist location ID: [id number]             Assessment/Plan:     1. Acute left-sided Bell's palsy.  Patient developed acute left-sided peripheral facial weakness, with no other abnormalities on neuro exam and unremarkable brain MRI most consistent with idiopathic/Bell's palsy.  She had antecedent unilateral left-sided self-limited hearing loss for 4 days last week before the development of the left facial droop which is not totally explained as it recovered on its own.  MRI brain shows no lesions in the left cerebellar pontine angle.  Question if she has some vasculitis in the left ear but further follow-up and monitoring is needed.    Would recommend to start the patient on prednisone 50 mg daily for 5 days and then taper down by 10 mg every day.  Since she has now new diagnosis with diabetes and also  developed ? allergic reaction to Solu-Medrol I would leave this to medical service to implement if agreeable and feel it is safe.  Will start valacyclovir 1 g p.o. daily for 1 week.  Overall her prognosis is favorable for further improvement and recovery eventually.  She would need close protection of her left cornea with artificial tears and Lacri-Lube cream.  She should follow with Neurology in 1 month post discharge.      Darcel Bayley, MD

## 2020-06-12 NOTE — Care Plan (Signed)
[facility]  Care Plan Note    Situation: Pt started on Metformin, Lipitor and Valocyclovir.     Intervention: Blood sugars remain high.     Response: Dr. Earlyne Iba examined pt and discussed Bell's Palsy diagnosis with her. Pt still has left facial weakness. Left arm and leg with no paresthesias now.       Josselyn Harkins, RN

## 2020-06-13 DIAGNOSIS — E1165 Type 2 diabetes mellitus with hyperglycemia: Secondary | ICD-10-CM

## 2020-06-13 DIAGNOSIS — Z2839 Other underimmunization status: Secondary | ICD-10-CM

## 2020-06-13 DIAGNOSIS — Z7984 Long term (current) use of oral hypoglycemic drugs: Secondary | ICD-10-CM

## 2020-06-13 LAB — MANUAL DIFFERENTIAL
EOSINOPHIL %: 2 % (ref 0–4)
EOSINOPHIL ABSOLUTE: 0.44 10*3/uL (ref 0.00–0.60)
LYMPHOCYTE %: 27 % (ref 24–44)
LYMPHOCYTE ABSOLUTE: 5.97 10*3/uL — ABNORMAL HIGH (ref 1.10–3.80)
MONOCYTE %: 6 % (ref 0–10)
MONOCYTE ABSOLUTE: 1.33 10*3/uL — ABNORMAL HIGH (ref 0.10–0.80)
NEUTROPHIL %: 65 % (ref 36–66)
NEUTROPHIL ABSOLUTE: 14.37 10*3/uL — ABNORMAL HIGH (ref 1.80–7.50)
PLATELET ESTIMATE: ADEQUATE
RBC MORPHOLOGY COMMENT: NORMAL
WBC MORPHOLOGY COMMENT: NORMAL
WBC: 22.1 10*3/uL

## 2020-06-13 LAB — HEPATIC FUNCTION PANEL
ALBUMIN/GLOBULIN RATIO: 1.1 — ABNORMAL LOW (ref 1.5–2.5)
ALBUMIN: 3.7 g/dL (ref 3.5–5.0)
ALKALINE PHOSPHATASE: 80 U/L (ref 38–126)
ALT (SGPT): 36 U/L — ABNORMAL HIGH (ref <35)
AST (SGOT): 28 U/L (ref 14–36)
BILIRUBIN DIRECT: 0 mg/dL (ref 0.0–0.3)
BILIRUBIN TOTAL: 0.5 mg/dL (ref 0.2–1.3)
PROTEIN TOTAL: 7.1 g/dL (ref 6.3–8.2)

## 2020-06-13 LAB — CBC WITH DIFF
HCT: 33 % — ABNORMAL LOW (ref 36.0–48.0)
HGB: 11 g/dL — ABNORMAL LOW (ref 11.6–14.8)
MCH: 29.7 pg (ref 24.4–34.0)
MCHC: 33.4 g/dL (ref 30.0–37.0)
MCV: 89.1 fL — ABNORMAL HIGH (ref 79.0–88.0)
MPV: 8.5 fL (ref 7.5–11.5)
PLATELETS: 401 10*3/uL — ABNORMAL HIGH (ref 130–400)
RBC: 3.7 10*6/uL (ref 3.50–5.50)
RDW: 13.5 % (ref 11.5–14.0)
WBC: 22.1 10*3/uL — ABNORMAL HIGH (ref 4.5–11.5)

## 2020-06-13 LAB — POC BLOOD GLUCOSE (RESULTS)
GLUCOSE, POC: 196 mg/dl — ABNORMAL HIGH (ref 74–106)
GLUCOSE, POC: 202 mg/dl — ABNORMAL HIGH (ref 74–106)

## 2020-06-13 LAB — BASIC METABOLIC PANEL
ANION GAP: 10 mmol/L (ref 5–19)
BUN/CREA RATIO: 45 — ABNORMAL HIGH (ref 6–20)
BUN: 15 mg/dL (ref 7–17)
CALCIUM: 9.2 mg/dL (ref 8.4–10.2)
CHLORIDE: 102 mmol/L (ref 98–107)
CO2 TOTAL: 26 mmol/L (ref 22–30)
CREATININE: 0.33 mg/dL — ABNORMAL LOW (ref 0.52–1.00)
ESTIMATED GFR: >60 mL/min/{1.73_m2} (ref >60)
GLUCOSE: 209 mg/dL — ABNORMAL HIGH (ref 74–106)
POTASSIUM: 4.1 mmol/L (ref 3.5–5.1)
SODIUM: 138 mmol/L (ref 137–145)

## 2020-06-13 LAB — BLUE TOP TUBE

## 2020-06-13 LAB — C-REACTIVE PROTEIN(CRP),INFLAMMATION: CRP INFLAMMATION: 9.3 mg/L (ref <10.0)

## 2020-06-13 LAB — LYME ANTIBODY PANEL WITH REFLEX
LYME IGG: NEGATIVE
LYME IGM: POSITIVE — AB

## 2020-06-13 LAB — PHOSPHORUS: PHOSPHORUS: 4.6 mg/dL — ABNORMAL HIGH (ref 2.5–4.5)

## 2020-06-13 LAB — CREATINE KINASE (CK), TOTAL, SERUM: CREATINE KINASE: 29 U/L — ABNORMAL LOW (ref 30–135)

## 2020-06-13 LAB — THYROID STIMULATING HORMONE WITH FREE T4 REFLEX: TSH: 2.39 u[IU]/mL (ref 0.465–4.680)

## 2020-06-13 LAB — PROCALCITONIN: PROCALCITONIN: 0.05 ng/mL (ref 0.00–0.08)

## 2020-06-13 LAB — MAGNESIUM: MAGNESIUM: 1.6 mg/dL (ref 1.6–2.3)

## 2020-06-13 MED ORDER — PREDNISONE 50 MG TABLET
50.0000 mg | ORAL_TABLET | Freq: Every morning | ORAL | 0 refills | Status: DC
Start: 2020-06-14 — End: 2020-06-20

## 2020-06-13 MED ORDER — VALACYCLOVIR 1 GRAM TABLET
1000.0000 mg | ORAL_TABLET | Freq: Every day | ORAL | 0 refills | Status: DC
Start: 2020-06-14 — End: 2020-06-13

## 2020-06-13 MED ORDER — INSULIN LISPRO 100 UNIT/ML SUB-Q - CHARGE BY DOSE
10.0000 [IU] | Freq: Three times a day (TID) | SUBCUTANEOUS | 0 refills | Status: DC
Start: 2020-06-13 — End: 2020-06-13

## 2020-06-13 MED ORDER — METFORMIN 1,000 MG TABLET
1000.0000 mg | ORAL_TABLET | Freq: Two times a day (BID) | ORAL | 4 refills | Status: DC
Start: 2020-06-13 — End: 2020-10-10

## 2020-06-13 MED ORDER — MAGNESIUM OXIDE 400 MG (241.3 MG MAGNESIUM) TABLET
400.0000 mg | ORAL_TABLET | Freq: Once | ORAL | Status: AC
Start: 2020-06-13 — End: 2020-06-13
  Administered 2020-06-13: 400 mg via ORAL
  Filled 2020-06-13: qty 1

## 2020-06-13 MED ORDER — GLIPIZIDE 10 MG TABLET
10.0000 mg | ORAL_TABLET | Freq: Every day | ORAL | 0 refills | Status: DC
Start: 2020-06-13 — End: 2020-08-01

## 2020-06-13 MED ORDER — PREDNISONE 50 MG TABLET
50.0000 mg | ORAL_TABLET | Freq: Every morning | ORAL | 0 refills | Status: DC
Start: 2020-06-14 — End: 2020-06-13

## 2020-06-13 MED ORDER — ATORVASTATIN 20 MG TABLET
20.0000 mg | ORAL_TABLET | Freq: Every evening | ORAL | 0 refills | Status: DC
Start: 2020-06-13 — End: 2020-06-13

## 2020-06-13 MED ORDER — METFORMIN ER 500 MG TABLET,EXTENDED RELEASE 24 HR
1000.0000 mg | ORAL_TABLET | Freq: Every evening | ORAL | 0 refills | Status: DC
Start: 2020-06-13 — End: 2020-06-13

## 2020-06-13 MED ORDER — INSULIN GLARGINE-YFGN (U-100) 100 UNIT/ML SUBQ - CHARGE BY DOSE
25.0000 [IU] | Freq: Every evening | SUBCUTANEOUS | 0 refills | Status: DC
Start: 2020-06-13 — End: 2020-06-13

## 2020-06-13 MED ORDER — INSULIN U-100 REGULAR HUMAN 100 UNIT/ML INJECTION SOLUTION
20.0000 [IU] | Freq: Two times a day (BID) | INTRAMUSCULAR | 0 refills | Status: AC
Start: 2020-06-13 — End: 2020-08-01

## 2020-06-13 MED ORDER — PRAVASTATIN 20 MG TABLET
20.0000 mg | ORAL_TABLET | Freq: Every evening | ORAL | 4 refills | Status: DC
Start: 2020-06-13 — End: 2020-10-10

## 2020-06-13 MED ORDER — VALACYCLOVIR 1 GRAM TABLET
1000.0000 mg | ORAL_TABLET | Freq: Every day | ORAL | 0 refills | Status: AC
Start: 2020-06-14 — End: 2020-06-19

## 2020-06-13 NOTE — Nurses Notes (Signed)
Spoke to patient this morning. Reinforced education provided yesterday. Discussed Metformin, obtaining a glucometer, and the importance of diet, exercise, checking glucose, and following up with a PCP. States that she received the handouts and booklets provided and had no questions regarding them at this time.     Lab Results   Component Value Date    GLUCOSENF 209 (H) 06/13/2020     Lab Results   Component Value Date    GLUIP 196 (H) 06/13/2020     Lab Results   Component Value Date    HA1C 9.6 (H) 06/11/2020       Please let me know if I can be of any further assistance. Ascom [id number]      Teressa Lower, CDE  06/13/2020, 09:42

## 2020-06-13 NOTE — Discharge Summary (Signed)
[facility]  DISCHARGE SUMMARY    PATIENT NAME:  Tammy Pham, Tammy Pham  MRN:  [id number]  DOB:  [birth year]    ENCOUNTER DATE:  06/11/2020  INPATIENT ADMISSION DATE:   DISCHARGE DATE:  06/13/2020    ATTENDING PHYSICIAN: No att. providers found  SERVICE: [facility] HOSPITALIST  PRIMARY CARE PHYSICIAN: No Pcp         LAY CAREGIVER:  ,  ,        PRIMARY DISCHARGE DIAGNOSIS:    Active Hospital Problems    Diagnosis Date Noted   . Encephalitis [G04.90] 06/12/2020      Resolved Hospital Problems   No resolved problems to display.     There are no active non-hospital problems to display for this patient.       DISCHARGE MEDICATIONS:     Current Discharge Medication List      START taking these medications.      Details   glipiZIDE 10 mg Tablet  Commonly known as: GLUCOTROL   10 mg, Oral, DAILY, Take 30 minutes before meals  Qty: 90 Tablet  Refills: 0     insulin regular human 100 unit/mL Solution   20 Units, Subcutaneous, 2 TIMES DAILY BEFORE MEALS  Qty: 12 mL  Refills: 0     MetFORMIN 1,000 mg Tablet  Commonly known as: GLUCOPHAGE   1,000 mg, Oral, 2 TIMES DAILY WITH FOOD  Qty: 180 Tablet  Refills: 4     pravastatin 20 mg Tablet  Commonly known as: PRAVACHOL   20 mg, Oral, EVERY EVENING  Qty: 90 Tablet  Refills: 4     predniSONE 50 mg Tablet  Commonly known as: DELTASONE  Start taking on: Jun 14, 2020   50 mg, Oral, EVERY MORNING WITH BREAKFAST  Qty: 7 Tablet  Refills: 0     valACYclovir 1 gram Tablet  Commonly known as: VALTREX  Start taking on: Jun 14, 2020   1 g, Oral, DAILY  Qty: 5 Tablet  Refills: 0          Discharge med list refreshed?  YES     ALLERGIES:  No Known Allergies          HOSPITAL PROCEDURE(S):   Bedside Procedures:  No orders of the defined types were placed in this encounter.    Surgical       REASON FOR HOSPITALIZATION AND HOSPITAL COURSE     BRIEF HPI:  This is a 35 y.o., female with no known past medical history presented with left facial palsy. Initial stroke workup with CT head and MRI brain without any  evidence of acute stroke or other pathology.  She was also complaining of left upper extremity tingling and numbness initially for which we recommended MRI C-spine however patient declined.  Today she states that her left upper extremity symptoms had resolved.  She was evaluated by Neurology and initiated on prednisone and Valtrex.  She was also found to be newly diabetic with HbA1c 9.6.  She was started on insulin regimen. Diabetic educator was consulted and discussed obtaining a glucometer, and the importance of diet, exercise, checking glucose, and following up with a PCP.  Discharge planning was complicated by her lack of insurance.  Social work/case management involved and improved based regimen of glipizide, metformin, Novolin N will be prescribed to the patient.  She was also incidentally found to be COVID positive but asymptomatic.  She is unvaccinated. Highly recommended her to follow-up with PCP.  Explained that her insulin requirements will  come down once she has completed prednisone and explained to her the symptoms of hypoglycemia and what to do in the event of hypoglycemia.  Printouts given.     Left facial palsy / Bell's palsy   New onset diabetes mellitus with hyperglycemia   Hyperlipidemia   COVID infection      DISCHARGE DISPOSITION:  Home discharge    GENERAL: The patient is well developed and nontoxic.  HEENT: Nonicteric sclerae, PERRLA  CHEST: Chest wall is nontender.  HEART: Regular rate and rhythm without murmurs.  LUNGS: Clear to auscultation bilaterally.  ABDOMEN: Soft, positive bowel sounds, nontender, no organomegaly.  SKIN: No rash, no excessive bruising, petechiae, or purpura.  NEUROLOGIC:  Left facial palsy, no other motor/sensory deficit.    DISCHARGE INSTRUCTIONS:   Follow-up Information     Ausi, Rami I, MD In 2 weeks.    Specialty: PSYCHIATRY AND NEUROLOGY, NEUROLOGY  Contact information:  [address]  [employer] [place] [employer] [place] [address]  [phone]             Pcp, No In 1 week.            Wynona Canes, MD In 1 week.    Specialty: Endocrinology, Diabetes, & Metabolism   Contact information:  [address]  [address]  [place] [address]  [phone]                       No discharge procedures on file.             Lynne Logan, MD    Copies sent to Care Team       Relationship Specialty Notifications Start End    Pcp, No PCP - General   06/11/20             Referring providers can utilize [email or web address] to access their referred [facility] Medicine patient's information.

## 2020-06-13 NOTE — Care Management Notes (Signed)
[place] Management Note    Patient Name: Tammy Pham  Date of Birth: 01-08-1986  Sex: female  Date/Time of Admission: 06/11/2020 10:41 PM  Room/Bed: 556/A  Payor: /    LOS: 0 days   Primary Care Providers:  Pcp, No (General)    Admitting Diagnosis:  Encephalitis [G04.90]    Assessment:   Covid+, Patent newly diagnosed Bells' Palsy and Diabetes. BS today=209. She met with the Diabetic Educator yesterday. They discussed medication costs including Metformin. Patient reports that she works at [employer], but that her [employer] will not start until November. She is up here t her boyfriend's house. She is from [place]. Instructed yesterday on the importance of establishing with a family MD. When asked- vague about whether she intends to stay here or go back to [place]. She plans on using [facility] and was given information on [facility] in [place] for follow up care and help with medications if she qualifies.     Discharge Plan:  Home (Patient/Family Member/other) (code 1)  Patient is a home plan when medically ready    The patient will continue to be evaluated for developing discharge needs.     Case Manager: Marylyn Ishihara, RN

## 2020-06-14 ENCOUNTER — Telehealth (HOSPITAL_COMMUNITY): Payer: Self-pay | Admitting: Nurse Practitioner

## 2020-06-14 LAB — HEP-2 SUBSTRATE ANTINUCLEAR ANTIBODIES (ANA), SERUM: ANA INTERPRETATION: NEGATIVE

## 2020-06-15 ENCOUNTER — Other Ambulatory Visit (HOSPITAL_COMMUNITY): Payer: Self-pay | Admitting: HOSPITALIST

## 2020-06-15 ENCOUNTER — Telehealth (HOSPITAL_COMMUNITY): Payer: Self-pay

## 2020-06-15 ENCOUNTER — Ambulatory Visit: Payer: PRIVATE HEALTH INSURANCE | Admitting: Nurse Practitioner

## 2020-06-15 DIAGNOSIS — Z7689 Persons encountering health services in other specified circumstances: Secondary | ICD-10-CM | POA: Insufficient documentation

## 2020-06-15 DIAGNOSIS — E785 Hyperlipidemia, unspecified: Secondary | ICD-10-CM | POA: Insufficient documentation

## 2020-06-15 DIAGNOSIS — E119 Type 2 diabetes mellitus without complications: Secondary | ICD-10-CM | POA: Insufficient documentation

## 2020-06-15 DIAGNOSIS — G510 Bell's palsy: Secondary | ICD-10-CM

## 2020-06-15 DIAGNOSIS — U071 COVID-19: Secondary | ICD-10-CM | POA: Insufficient documentation

## 2020-06-15 MED ORDER — DOXYCYCLINE HYCLATE 100 MG CAPSULE
100.0000 mg | ORAL_CAPSULE | Freq: Two times a day (BID) | ORAL | 0 refills | Status: AC
Start: 2020-06-15 — End: 2020-06-25

## 2020-06-15 NOTE — Progress Notes (Signed)
Transitional Care services provided:  Discharge documentation was reviewed, Education about medication(s) provided to patient/ family/ caregiver   and Education provided to patient-family-caregiver about how to access care or advice   Transition of Care Contact  Discharge Date: 06/13/2020  Transition Facility Type  Facility Name  Interactive Contact(s): Completed or attempted contact indicated by Date/Time  Completed Contact: 06/14/2020 11:00 AM  Contact Method(s)-- Patient/Caregiver Telephone  Clinical Staff Name/Role who contacted--Paula Saseen  Transition Assessment  Discharge Summary obtained?  How are you recovering?  Discharge Meds obtained?  Medications reconcilled with Discharge Documentation?  Medication Concerns?  Have everything needed for recovery?  Care Coordination:   Patient has transition follow-up appointment date and time?--Yes  Primary Care Transition Visit planned?  Specialist Transition Visit planned?  Patient/caregiver plans to attend transition visit?--Yes  Primary Follow-up Barrier  Home Health or DME ordered at discharge?  Clinician/Team notified?--Yes  Primary reason clinician notified?  Transition Note:   Transition of Care Contact  Discharge Date: 06/13/2020  Transition Facility Type  Facility Name  Interactive Contact(s): Completed or attempted contact indicated by Date/Time  Completed Contact: 06/14/2020 11:00 AM  Contact Method(s)-- Patient/Caregiver Telephone  Clinical Staff Name/Role who contacted--Paula Saseen  Transition Assessment  Discharge Summary obtained?  How are you recovering?  Discharge Meds obtained?  Medications reconcilled with Discharge Documentation?  Medication Concerns?  Have everything needed for recovery?  Care Coordination:   Patient has transition follow-up appointment date and time?--Yes  Primary Care Transition Visit planned?  Specialist Transition Visit planned?  Patient/caregiver plans to attend transition visit?--Yes  Primary Follow-up Barrier  Home Health  or DME ordered at discharge?  Clinician/Team notified?--Yes  Primary reason clinician notified?  Transition Note:   Called patient to confirm follow-up visit scheduled on 06/15/20 at 3:00pm.    Further information may be documented in relevant telephone or outreach encounter.       Further information may be documented in relevant telephone or outreach encounter.      TR[facility], [facility] [address]  [place] [address]  Operated by [facility]  Video Visit     Name: Tammy Pham  MRN: [id number]    Date: 06/15/2020  Age: 35 y.o.                            Patient's location: Home -  [place] [address]   Patient/family aware of provider location: Yes  Patient/family consent for video visit: Yes  Interview and observation performed by: Dyanne Iha, FNP-C    Chief Complaint: Hospital Discharge Transition    History of Present Illness:  Tammy Pham is a 35 y.o. female seen via telehealth in the Transitional Care Clinic for follow-up after discharge on on 5/24 wit ha new diagnosis of diabetes, covid, and bells palsy. Orginally presented with stroke like symptoms but work-up was negative. Patient denies any problems since discharge. Taking all medications as prescribed. States that she was called by the ER physician today and told that she has Lyme disease and he is sending in a prescription for antibiotics. Is not currently established with a PCP and would like to find one herself. States that she was given information about [facility] during admission. Blood sugar this AM was 148. Reports that her symptoms that she presented for to the ER have resolved. Regular appetite and bowel movements. Denies any chest pain, sob, dizziness, syncope, fever, chills, n/v/d.    Past  Medical History:  She has a past medical history of COVID-19 (06/11/2020), Nasal fracture, PCOS (polycystic ovarian syndrome), and Right hand fracture.    Past Surgical History:  She has no past surgical  history on file.    Problem List:  She has Encephalitis on their problem list.    Medications:  .  doxycycline hyclate  .  glipiZIDE  .  insulin regular human  .  MetFORMIN  .  pravastatin  .  predniSONE  .  valACYclovir     Review of Systems:  Other than ROS in the HPI, all other systems were negative.    Observational Exam:     General: appears in good health, no distress, well-developed  Skin: No rashes or lesions, normal color  Eyes: Pupils equal and round.   HENT:Head atraumatic and normocephalic  Lungs: Normal breathing, respirations non-labored   Neurologic: Grossly normal, Gait is normal, Alert and oriented x3, No tremor  Psychiatric: Normal, no depression, no anxiety    Data Reviewed:   Labs, imaging, and past medical records reviewed.      Assessment/Plan:       2 ICD-10-CM    1. Encounter for support and coordination of transition of care  Z76.89    2. Type 2 diabetes mellitus (CMS HCC)  E11.9    3. COVID  U07.1    4. Hyperlipidemia, unspecified hyperlipidemia type  E78.5    5. Bell's palsy  G51.0      No orders of the defined types were placed in this encounter.    Follow Up:      Patient Instructions   Continue current medications  Follow-up with PCP as directed  Information for finding a new PCP provided during hospital stay   Diet and Lifestyle, age related risks and counseling was performed today. Low fat, low sodium, low cholesterol, 1800 cal ADA diet recommended as well as increasing physical activity.  Check blood glucose at home like discussed.  Bring log to blood glucose readings to visit with PCP.  Counseled on the appropriate management of hypoglycemia.  Please call our office at [phone] with any questions or concerns about your hospital stay  Call PCP or go to the ER with any new or concerning symptoms       Dyanne Iha, FNP-C  06/15/2020, 15:14    This note was partially created using M*Modal fluency direct system (voice recognition software ) and is inherently subject to errors  including those of syntax and "sound- alike" substitutions which may escape proofreading.  In such instances, original meaning may be extrapolated by contextual derivation.

## 2020-06-15 NOTE — Progress Notes (Signed)
Went over medications with the patient. She got all medications filled and has been taking them as prescribed. There were no questions or concerns at this time.       Johnston Maddocks  06/15/2020, 15:02

## 2020-06-15 NOTE — Result Encounter Note (Signed)
Lyme IgM +, likely causing Bells palsy. Called patient and updated test results and recommended 10 day course of Doxycycline. Prescribing. She will pick up from [facility] in [place].

## 2020-06-15 NOTE — Telephone Encounter (Signed)
Quality Management Physician Locator Line was contacted for this patient to assist in obtaining a Primary Care Provider.       Patient was accepted by the office of Damien Fusi, NP as their Primary Care Provider.     The appointment is scheduled for 06/20/2020 at 1:15PM.     Address: [address] [place], [place] [address]    Phone Number: [phone]     Call placed to patient to notify of Primary Care Provider and appointment date/time.  Patient instructed to bring all medications/insurance cards, and arrive 15 minutes early to their appointment.

## 2020-06-15 NOTE — Patient Instructions (Signed)
Continue current medications  Follow-up with PCP as directed  Information for finding a new PCP provided during hospital stay   Diet and Lifestyle, age related risks and counseling was performed today. Low fat, low sodium, low cholesterol, 1800 cal ADA diet recommended as well as increasing physical activity.  Check blood glucose at home like discussed.  Bring log to blood glucose readings to visit with PCP.  Counseled on the appropriate management of hypoglycemia.  Please call our office at [phone] with any questions or concerns about your hospital stay  Call PCP or go to the ER with any new or concerning symptoms

## 2020-06-16 LAB — LYME DISEASE ANTIBODY, IMMUNOBLOT, SERUM
18 KD (IGG) BAND: NONREACTIVE
23 KD (IGG) BAND: NONREACTIVE
23 KD (IGM) BAND: REACTIVE — AB
28 KD (IGG) BAND: NONREACTIVE
30 KD (IGG) BAND: NONREACTIVE
39 KD (IGG) BAND: NONREACTIVE
39 KD (IGM) BAND: NONREACTIVE
41 KD (IGG) BAND: REACTIVE — AB
41 KD (IGM) BAND: NONREACTIVE
45 KD (IGG) BAND: NONREACTIVE
58 KD (IGG) BAND: REACTIVE — AB
66 KD (IGG) BAND: NONREACTIVE
93 KD (IGG) BAND: REACTIVE — AB
LYME DISEASE AB(IGG),BLOT: NEGATIVE
LYME DISEASE AB(IGM),BLOT: NEGATIVE

## 2020-06-17 LAB — ANCA SCREEN WITH REFLEX TO ANCA TITER: ANCA SCREEN: NEGATIVE

## 2020-06-20 ENCOUNTER — Encounter (INDEPENDENT_AMBULATORY_CARE_PROVIDER_SITE_OTHER): Payer: Self-pay | Admitting: Family

## 2020-06-20 ENCOUNTER — Ambulatory Visit (INDEPENDENT_AMBULATORY_CARE_PROVIDER_SITE_OTHER): Payer: Self-pay | Admitting: Family

## 2020-06-20 ENCOUNTER — Other Ambulatory Visit: Payer: Self-pay

## 2020-06-20 ENCOUNTER — Ambulatory Visit (INDEPENDENT_AMBULATORY_CARE_PROVIDER_SITE_OTHER): Payer: PRIVATE HEALTH INSURANCE | Admitting: Family

## 2020-06-20 VITALS — BP 110/80 | HR 94 | Temp 98.3°F | Resp 16 | Ht 61.0 in | Wt 192.0 lb

## 2020-06-20 DIAGNOSIS — E782 Mixed hyperlipidemia: Secondary | ICD-10-CM

## 2020-06-20 DIAGNOSIS — A6920 Lyme disease, unspecified: Secondary | ICD-10-CM | POA: Insufficient documentation

## 2020-06-20 DIAGNOSIS — U071 COVID-19: Secondary | ICD-10-CM

## 2020-06-20 DIAGNOSIS — E119 Type 2 diabetes mellitus without complications: Secondary | ICD-10-CM | POA: Insufficient documentation

## 2020-06-20 DIAGNOSIS — H6122 Impacted cerumen, left ear: Secondary | ICD-10-CM

## 2020-06-20 DIAGNOSIS — Z09 Encounter for follow-up examination after completed treatment for conditions other than malignant neoplasm: Secondary | ICD-10-CM

## 2020-06-20 DIAGNOSIS — G510 Bell's palsy: Secondary | ICD-10-CM | POA: Insufficient documentation

## 2020-06-20 MED ORDER — CARBAMIDE PEROXIDE 6.5 % EAR DROPS
5.0000 [drp] | Freq: Two times a day (BID) | OTIC | 0 refills | Status: AC
Start: 2020-06-20 — End: 2020-06-24

## 2020-06-20 NOTE — Progress Notes (Signed)
[facility]  FAMILY MEDICINE, [facility]  [phone]  [place] [place] [id number]  [phone]  06/20/2020       Name:  Tammy CapesYolanda Pham MRN: [id number]   Date:    06/20/2020 Age:  35 y.o.       Reason for Visit:  Chief Complaint   Patient presents with   . Follow Up     Hospital follow up was dx with bells palsy, lime disease and covid.          Subjective:   Tammy Pham is a 35 y.o. female who comes in today for hospital follow-up and to establish care.  Patient denies any medical history other than PCOS.  Patient states on May 21st she developed left-sided facial drooping.  She noted she was unable to drink elevation straw.  She was having difficulty blinking or closing her left eye.  She states the [place] to [place] and during the drive she noticed left arm tingling and numbness.  When she got the [place] she decided to go to the emergency room due to the symptoms.  Patient did undergo a stroke workup with CT the head and MRI of the brain without any evidence of acute stroke or other pathology.  Neurology was consulted and diagnosed patient with Bell's palsy.  She was started on prednisone and Valtrex.  She states her mother had Bell's palsy approximately 10 years ago.  Patient also reports approximately 4 days prior to the facial drooping and numbness symptoms that her left ear popped and she could not hear out of it.  She states this has resolved.  She continues to have some left-sided facial drooping and she cannot completely close the left eye.  The left-sided numbness has resolved.  Patient was also diagnosed with COVID but was asymptomatic.  She has not been vaccinated for COVID.  She also tested positive for Lyme disease and was started on doxycycline 100 mg twice a day for 10 days.  Her initial blood sugar was 306 and she was found to have an A1c of 9.6.  She was diagnosed with type 2 diabetes and started on metformin,  glipizide, and insulin.  Patient states her blood sugars are running fasting 103-156 and in the evening 160 to 230. She is monitoring her blood sugar twice a day.  She did receive education from the diabetic educator while she was in the hospital and is following an ADA diet.  Her cholesterol is 196, triglycerides 194, HDL 37 and LDL 120 and she was started on pravastatin.  Sed rate 67. White blood cell count 17.8.  The rest of her lab work was essentially normal.  Patient states she did not get the flu vaccine but has had a Tdap.  She states she has an eye exam every year and does wear glasses.  She states it has been a couple of years since she has had a dental exam.  She [facility] 1-2 years.  She states she did have an abnormal Pap smear couple years ago so she does keep up with her gynecology exams.  She is using ROHTO eyedrops 2 drops once a day.  Patient was discharged in stable condition from the hospital on May 24th.  She has no complaints or concerns today.  No refills needed.          Past Medical History:  Past Medical History:   Diagnosis Date   . Bell's palsy    . COVID-19 06/11/2020   . History of COVID-19    . Lyme disease    . Nasal fracture    . PCOS (polycystic ovarian syndrome)    . Right hand fracture     5th metacarpal fracture      Past Surgical History:  No past surgical history on file.  Family History:    Family Medical History:     Problem Relation (Age of Onset)    Diabetes Mother, Maternal Grandmother           Social History:   Social History     Tobacco Use   . Smoking status: Never Smoker   . Smokeless tobacco: Never Used   Substance Use Topics   . Alcohol use: Yes     Comment: occasional      Social History     Substance and Sexual Activity   Drug Use Never      Social History     Social History Narrative   . Not on file        Allergies:  No Known Allergies   Home Medications:    Outpatient Medications Marked as Taking  for the 06/20/20 encounter (Office Visit) with Shambhavi Salley, Gilford Rile, CNP   Medication Sig   . carbamide peroxide (DEBROX) 6.5 % Otic Drops Administer 5 Drops into the left ear Twice daily - in morning and at bedtime for 4 days   . doxycycline hyclate (VIBRAMYCIN) 100 mg Oral Capsule Take 1 Capsule (100 mg total) by mouth Twice daily for 10 days   . glipiZIDE (GLUCOTROL) 10 mg Oral Tablet Take 1 Tablet (10 mg total) by mouth Once a day for 90 days Take 30 minutes before meals   . insulin regular human 100 unit/mL Injection Solution Inject 20 Units under the skin Twice a day before meals for 30 days   . MetFORMIN (GLUCOPHAGE) 1,000 mg Oral Tablet Take 1 Tablet (1,000 mg total) by mouth Twice daily with food   . pravastatin (PRAVACHOL) 20 mg Oral Tablet Take 1 Tablet (20 mg total) by mouth Every evening            Review of Systems:   Review of Systems   Constitutional: Negative for chills, fatigue, fever and unexpected weight change.   HEENT: Negative for congestion, hearing loss, mouth sores, postnasal drip, rhinorrhea, sinus pressure, sinus pain, ear discharge, ear pain, tinnitus, sore throat, trouble swallowing and voice change.    Eyes: Negative for pain, discharge, redness and visual disturbance. Unable to close left eye.  Respiratory: Negative for cough, chest tightness, shortness of breath and wheezing.    Cardiovascular: Negative for chest pain, palpitations and leg swelling.   Gastrointestinal: Negative for abdominal distention, abdominal pain, blood in stool, constipation, diarrhea, nausea and vomiting, reflux or heartburn.  Endocrine: Negative for cold intolerance, heat intolerance, polydipsia. Positive for polyphagia and polyuria.   Genitourinary: Negative for dysuria, hematuria and urgency. Positive for frequency.   Musculoskeletal: Negative for arthralgias, back pain, gait problem, myalgias. Positive for neck pain and stiffness.   Skin: Negative for rash and wound. Positive for some facial discoloration on  cheeks.   Neurological: Negative for dizziness, weakness, numbness or tingling, headaches and memory problems.Positive for lightheadedness.  Psychiatric/Behavioral:  Negative for depression, anxiety, mood swings, sleep disturbances, or suicidal ideations.      All systems have been reviewed and found to be negative except  for as stated above in the history of present illness.     Physical Examination:   Vitals: BP 110/80 (Site: Left, Patient Position: Sitting, Cuff Size: Adult Large)   Pulse 94   Temp 36.8 C (98.3 F) (Thermal Scan)   Resp 16   Ht 1.549 m (5\' 1" )   Wt 87.1 kg (192 lb)   LMP 06/09/2020   SpO2 99%   BMI 36.28 kg/m        Physical Exam:  General Appearance:  alert, comfortable, cooperative, no acute distress.  HEENT:  PERRLA: EOM's intact, conjunctiva clear, unable to completely close left eye; external auditory canals clear on the right, impacted with cerumen on the left; TM's normal on the right, unable to visualize on the left; nasal turbinates pink with no edema or nasal drainage; teeth in good repair; pharynx pink with no exudate; no oral lesions.  Neck/Thyroid:  neck supple, no carotid bruits, lymphadenopathy or thyromegaly.  Cardiovascular:  regular rhythm with controlled rate, normal S1 and S2, no murmurs; no lower extremity edema, pedal pulses palpable.  Respiratory:  lungs clear to auscultation AP bilaterally; no wheezes or rhonchi; eupneic at rest.  Gastrointestinal:  abdomen soft, symmetrical, non-tender with bowel sounds auscultated; no masses or guarding upon palpation.  Skin:  flesh tone, warm, dry with good turgor; no rashes or lesions.  Musculoskeletal:  no tenderness or deformity of cervical, thoracic or lumbar spine with good ROM; normal ROM of upper and lower extremities with 5/5 strength.  Neurological:  gait normal; cranial nerves II-VI grossly intact with no focal deficits; cranial nerve 7 left-sided facial weakness noted; finger to nose normal; light tough sensation  intact.  Psych:  alert and oriented x 3; normal affect, makes good eye contact, conversation appropriate.      Assessment:   (Z09) Hospital discharge follow-up  (primary encounter diagnosis)  Plan: CBC/DIFF    (G51.0) Bell's palsy  Plan: Refer to External Provider    (A69.20) Lyme disease  Plan: CBC/DIFF    (U07.1) COVID-19    (E78.2) Mixed hyperlipidemia  Plan: COMPREHENSIVE METABOLIC PNL, FASTING, LIPID         PANEL    (E11.9) Type 2 diabetes mellitus without complication, without long-term current use of insulin (CMS HCC)  Plan: HGA1C (HEMOGLOBIN A1C WITH EST AVG GLUCOSE),         COMPREHENSIVE METABOLIC PNL, FASTING,         MICROALBUMIN URINE, RANDOM    (H61.22) Impacted cerumen of left ear  Plan: carbamide peroxide (DEBROX) 6.5 % Otic Drops     Plan:   New onset type 2 diabetes.  Continue medications as prescribed.  Check fasting blood sugar in 2 hours after supper, record and bring log to next appointment in 6 weeks.  Informed of signs and symptoms of hypoglycemia and advised to call office.  Encouraged to follow an 1800 to 2000 calorie ADA diet.  She is monitoring her carbs.  CT of the brain and MRI reviewed.  Hospital records reviewed.  Medication reconciliation performed.  No refills needed.  Continue eyedrops as ordered.  Referral placed to Dr.Rami, neurology for Bell's palsy.  Recommend routine eye and dental exams.  Recommend continued follow-up with gynecology.  Left ear impacted with cerumen. Debrox ear drops ordered, patient advised how to use them.  Return in 1 week for left ear irrigation.  Lab work ordered for 3 months.  Patient advised to call office for any new or concerning symptoms.  COVID quarantine as  ended, advised to continue wearing mask for an additional 5 days.  Wash hands frequently and maintain social distancing.  Patient verbalized understanding of treatment plan.    Patient was counseled on diagnosis, follow-up, and side effect of medications.  Reasons for follow up in clinic or  emergency department were discussed.    Orders:    Orders Placed This Encounter   . HGA1C (HEMOGLOBIN A1C WITH EST AVG GLUCOSE)   . COMPREHENSIVE METABOLIC PNL, FASTING   . CBC/DIFF   . MICROALBUMIN URINE, RANDOM   . LIPID PANEL   . Refer to External Provider   . carbamide peroxide (DEBROX) 6.5 % Otic Drops      Referrals:   Orders Placed This Encounter   Procedures   . Refer to External Provider      Discharge Medication List:       Current Discharge Medication List          Accurate as of Jun 20, 2020  5:49 PM. If you have any questions, ask your nurse or doctor.            START taking these medications.      Details   carbamide peroxide 6.5 % Drops  Commonly known as: DEBROX  Started by: Gilford Rile Keshav Winegar, CNP   5 Drops, Left Ear, 2 TIMES DAILY MORNING/BEDTIME  Qty: 1 Each  Refills: 0        CONTINUE these medications - NO CHANGES were made during your visit.      Details   doxycycline hyclate 100 mg Capsule  Commonly known as: VIBRAMYCIN   100 mg, Oral, 2 TIMES DAILY  Qty: 20 Capsule  Refills: 0     glipiZIDE 10 mg Tablet  Commonly known as: GLUCOTROL   10 mg, Oral, DAILY, Take 30 minutes before meals  Qty: 90 Tablet  Refills: 0     insulin regular human 100 unit/mL Solution   20 Units, Subcutaneous, 2 TIMES DAILY BEFORE MEALS  Qty: 12 mL  Refills: 0     MetFORMIN 1,000 mg Tablet  Commonly known as: GLUCOPHAGE   1,000 mg, Oral, 2 TIMES DAILY WITH FOOD  Qty: 180 Tablet  Refills: 4     pravastatin 20 mg Tablet  Commonly known as: PRAVACHOL   20 mg, Oral, EVERY EVENING  Qty: 90 Tablet  Refills: 4        STOP taking these medications.    predniSONE 50 mg Tablet  Commonly known as: DELTASONE  Stopped by: Bertram Savin, CNP               Follow up: Return in about 6 weeks (around 08/01/2020) for In Person Visit. Patient was advised to follow up sooner if issues arise.     Bertram Savin, CNP    This note was partially created using M*Modal fluency direct system (voice recognition software ) and is inherently  subject to errors including those of syntax and "sound- alike" substitutions which may escape proofreading.  In such instances, original meaning may be extrapolated by contextual derivation.

## 2020-06-26 ENCOUNTER — Other Ambulatory Visit: Payer: Self-pay

## 2020-06-26 ENCOUNTER — Ambulatory Visit (INDEPENDENT_AMBULATORY_CARE_PROVIDER_SITE_OTHER): Payer: PRIVATE HEALTH INSURANCE

## 2020-06-26 ENCOUNTER — Encounter (INDEPENDENT_AMBULATORY_CARE_PROVIDER_SITE_OTHER): Payer: PRIVATE HEALTH INSURANCE | Admitting: Family

## 2020-06-26 ENCOUNTER — Telehealth (INDEPENDENT_AMBULATORY_CARE_PROVIDER_SITE_OTHER): Payer: Self-pay | Admitting: Family

## 2020-06-26 DIAGNOSIS — H6122 Impacted cerumen, left ear: Secondary | ICD-10-CM

## 2020-06-26 NOTE — Telephone Encounter (Signed)
06-26-20 Dr. Evalee Jefferson Neurologist pratices in [place].  Sees patient only in patient [place].  I called patient after speaking with Daren A. [phone]  offered [facility] office or other neurologist at [facility]  Per patient symptoms better CANCEL referral at this time.  Elease Hashimoto Torbett Np/Sharelle Burditt

## 2020-06-26 NOTE — Nursing Note (Signed)
Left ear was irrigated with production of a large amount of cerumen.  Patient tolerated procedure well.

## 2020-08-01 ENCOUNTER — Other Ambulatory Visit: Payer: Self-pay

## 2020-08-01 ENCOUNTER — Encounter (INDEPENDENT_AMBULATORY_CARE_PROVIDER_SITE_OTHER): Payer: Self-pay | Admitting: Family

## 2020-08-01 ENCOUNTER — Ambulatory Visit (INDEPENDENT_AMBULATORY_CARE_PROVIDER_SITE_OTHER): Payer: BC Managed Care – PPO | Admitting: Family

## 2020-08-01 VITALS — BP 118/70 | HR 88 | Temp 97.9°F | Resp 16 | Ht 61.0 in | Wt 193.0 lb

## 2020-08-01 DIAGNOSIS — G510 Bell's palsy: Secondary | ICD-10-CM

## 2020-08-01 DIAGNOSIS — E119 Type 2 diabetes mellitus without complications: Secondary | ICD-10-CM

## 2020-08-01 DIAGNOSIS — Z0000 Encounter for general adult medical examination without abnormal findings: Secondary | ICD-10-CM

## 2020-08-01 DIAGNOSIS — Z6836 Body mass index (BMI) 36.0-36.9, adult: Secondary | ICD-10-CM

## 2020-08-01 DIAGNOSIS — E782 Mixed hyperlipidemia: Secondary | ICD-10-CM

## 2020-08-01 NOTE — Progress Notes (Signed)
[facility]  FAMILY MEDICINE, ST. [facility]  [phone] [place]. [place] [address]  [phone]  08/01/2020       Name:  Tammy Pham MRN: [id number]   Date:    08/01/2020 Age:  35 y.o.       Reason for Visit:  Chief Complaint   Patient presents with   . Follow Up     6 week follow up DM C/o episodes of feeling dizzy, shaky, and lightheaded so she checked BS and it was 60 but has typically been running in the 150's. Has log for review.         Subjective:   Tammy Pham is a 35 y.o. female who comes in today for 6 week follow-up for diabetes, Bell's palsy, COVID-19.  She states she is feeling much better overall.  She is now able to close her left eye completely, no facial drooping, no numbness or tingling  She decided not to follow up with Neurology referral.  Patient was newly diagnosed with diabetes with A1c of 9.6 and started on glipizide, metformin, and insulin.  She does have her blood sugar log on her phone and usually checks it twice a day fasting and before dinner.  Her fasting blood sugar has been running 120-150.  And in the evening 130-160.  She does complain of 3-4 episodes of feeling dizzy, lightheaded, and shaky.  During this time she has checked her blood sugar and has been 60-80 and she had to eat something sweet to bring it up.  I would like to try stopping the glipizide to see if that helps her hypoglycemia.  She is scheduled to have her A1c rechecked the end of August.  Patient states she has been watching her diet closely is not been eating sweets.  Had she is eating a sugar free diet.  She is drinking a lot of water, no sugary drinks.  She is due for an eye exam and was advised to schedule this.  She has not had any routine gynecology care or Pap smears for greater than 5 years.  Will place referral to Gynecology.  She has no other complaints or concerns voiced today.  No refills needed.    Past Medical History:    Past Medical History:    Diagnosis Date   . Bell's palsy    . COVID-19 06/11/2020   . History of COVID-19    . Lyme disease    . Nasal fracture    . PCOS (polycystic ovarian syndrome)    . Right hand fracture     5th metacarpal fracture      Past Surgical History:  No past surgical history on file.  Family History:    Family Medical History:     Problem Relation (Age of Onset)    Diabetes Mother, Maternal Grandmother           Social History:   Social History     Tobacco Use   . Smoking status: Never Smoker   . Smokeless tobacco: Never Used   Substance Use Topics   . Alcohol use: Yes     Comment: occasional      Social History     Substance and Sexual Activity   Drug Use Never      Social History     Social History Narrative   . Not on file        Allergies:  No Known Allergies   Home Medications:  Outpatient Medications Marked as Taking for the 08/01/20 encounter (Office Visit) with Bertram Savin, CNP   Medication Sig   . insulin regular human 100 unit/mL Injection Solution Inject 20 Units under the skin Twice a day before meals for 30 days   . MetFORMIN (GLUCOPHAGE) 1,000 mg Oral Tablet Take 1 Tablet (1,000 mg total) by mouth Twice daily with food   . pravastatin (PRAVACHOL) 20 mg Oral Tablet Take 1 Tablet (20 mg total) by mouth Every evening            Review of Systems:   Review of Systems   Constitutional: Negative for chills, fatigue, fever and unexpected weight change.   HEENT: Negative for congestion, hearing loss, mouth sores, postnasal drip, rhinorrhea, sinus pressure, sinus pain, ear discharge, ear pain, tinnitus, sore throat, trouble swallowing and voice change.    Eyes: Negative for pain, discharge, redness and visual disturbance.   Respiratory: Negative for cough, chest tightness and wheezing. Positive for some SOB with talking.   Cardiovascular: Negative for chest pain, palpitations and leg swelling.   Gastrointestinal: Negative for abdominal distention, abdominal pain, blood in stool, constipation, diarrhea, nausea  and vomiting, reflux or heartburn.  Endocrine: Negative for cold intolerance, heat intolerance, polydipsia, polyphagia and polyuria.   Genitourinary: Negative for dysuria, frequency, hematuria and urgency.   Musculoskeletal: Negative for arthralgias, back pain, gait problem, myalgias. Positive for neck pain and stiffness.   Skin: Negative for color change, rash and wound.   Neurological: Negative for weakness, numbness or tingling, headaches and memory problems. Positive for 4 episodes of dizziness, lightheadedness, shakiness when glucose < 80.   Psychiatric/Behavioral:  Negative for depression, anxiety, mood swings, sleep disturbances, or suicidal ideations.      All systems have been reviewed and found to be negative except for as stated above in the history of present illness.     Physical Examination:   Vitals: BP 118/70 (Site: Left, Patient Position: Sitting, Cuff Size: Adult Large)   Pulse 88   Temp 36.6 C (97.9 F) (Thermal Scan)   Resp 16   Ht 1.549 m (5\' 1" )   Wt 87.5 kg (193 lb)   SpO2 99%   BMI 36.47 kg/m        Physical Exam:  General Appearance:  alert, comfortable, cooperative, no acute distress.  Neck/Thyroid:  neck supple, no carotid bruits, lymphadenopathy or thyromegaly.  Cardiovascular:  regular rhythm with controlled rate, normal S1 and S2, no murmurs; no lower extremity edema, pedal pulses palpable.  Respiratory:  lungs clear to auscultation AP bilaterally; no wheezes or rhonchi; eupneic at rest.  Gastrointestinal:  abdomen soft, symmetrical, non-tender with bowel sounds auscultated; no masses or guarding upon palpation.  Skin:  flesh tone, warm, dry with good turgor; no rashes or lesions.  Musculoskeletal:  no tenderness or deformity of cervical, thoracic or lumbar spine with good ROM; normal ROM of upper and lower extremities with 5/5 strength.  Neurological:  gait normal; cranial nerves II-VII grossly intact with no focal deficits; finger to nose normal; light tough sensation  intact.  Psych:  alert and oriented x 3; normal affect, makes good eye contact, conversation appropriate.      Assessment:   (E11.9) Type 2 diabetes mellitus without complication, without long-term current use of insulin (CMS HCC)  (primary encounter diagnosis)    (E78.2) Mixed hyperlipidemia    (G51.0) Bell's palsy    (Z00.00) Preventative health care  Plan: Refer to [facility], [facility]  [address] ([place])     Plan:   DC glipizide due to hypoglycemia episodes.  Continue to follow ADA diet, avoid excess carbs and sweets.  Make sure to have a healthy snack at bedtime.  Continue to monitor and record blood sugar twice a day, bring log to appointments.  Reinforced patient of symptoms of hypoglycemia and advised her to call if this occurs.  Advised dilated diabetic eye exam.  Referral placed to gynecology for routine gynecology care.  Bell's palsy symptoms have resolved, she did complete steroids and Valtrex.  Obtain lab work as previously ordered prior to next appointment.  Patient verbalized understanding of treatment plan.    Patient was counseled on diagnosis, follow-up, and side effect of medications.  Reasons for follow up in clinic or emergency department were discussed.    Orders:    Orders Placed This Encounter   . Refer to [facility] 230, [facility] 3 ([place])      Referrals:   Orders Placed This Encounter   Procedures   . Refer to [facility] 230, [facility] 3 [place])      Discharge Medication List:       Current Discharge Medication List          Accurate as of August 01, 2020  9:15 AM. If you have any questions, ask your nurse or doctor.            CONTINUE these medications - NO CHANGES were made during your visit.      Details   insulin regular human 100 unit/mL Solution   20 Units, Subcutaneous, 2 TIMES DAILY BEFORE MEALS  Qty: 12 mL  Refills: 0     MetFORMIN 1,000 mg Tablet  Commonly known as: GLUCOPHAGE   1,000 mg, Oral, 2 TIMES DAILY WITH FOOD  Qty: 180  Tablet  Refills: 4     pravastatin 20 mg Tablet  Commonly known as: PRAVACHOL   20 mg, Oral, EVERY EVENING  Qty: 90 Tablet  Refills: 4        STOP taking these medications.    glipiZIDE 10 mg Tablet  Commonly known as: GLUCOTROL  Stopped by: Bertram Savin, CNP               Follow up: Return in about 6 weeks (around 09/12/2020) for Follow up after testing, In Person Visit. Patient was advised to follow up sooner if issues arise.     Bertram Savin, CNP    This note was partially created using M*Modal fluency direct system (voice recognition software ) and is inherently subject to errors including those of syntax and "sound- alike" substitutions which may escape proofreading.  In such instances, original meaning may be extrapolated by contextual derivation.

## 2020-08-28 ENCOUNTER — Encounter (INDEPENDENT_AMBULATORY_CARE_PROVIDER_SITE_OTHER): Payer: Self-pay | Admitting: Family

## 2020-08-28 NOTE — Telephone Encounter (Signed)
-----   Message from Nyra Capes sent at 08/28/2020 12:24 PM EDT -----  Regarding: Bloodwork   Hi, can someone please send me the required blood work order via email to [email or web address] or tell me where I can located on the [place] medical log in? Thank you !

## 2020-09-12 ENCOUNTER — Encounter (INDEPENDENT_AMBULATORY_CARE_PROVIDER_SITE_OTHER): Payer: Self-pay | Admitting: Family

## 2020-09-26 ENCOUNTER — Encounter (INDEPENDENT_AMBULATORY_CARE_PROVIDER_SITE_OTHER): Payer: BC Managed Care – PPO | Admitting: Family

## 2020-10-10 ENCOUNTER — Ambulatory Visit (INDEPENDENT_AMBULATORY_CARE_PROVIDER_SITE_OTHER): Payer: BC Managed Care – PPO | Admitting: Family

## 2020-10-10 ENCOUNTER — Encounter (INDEPENDENT_AMBULATORY_CARE_PROVIDER_SITE_OTHER): Payer: Self-pay | Admitting: Family

## 2020-10-10 ENCOUNTER — Ambulatory Visit: Payer: BC Managed Care – PPO | Attending: Family

## 2020-10-10 ENCOUNTER — Telehealth (INDEPENDENT_AMBULATORY_CARE_PROVIDER_SITE_OTHER): Payer: Self-pay | Admitting: Family

## 2020-10-10 ENCOUNTER — Other Ambulatory Visit: Payer: Self-pay

## 2020-10-10 VITALS — BP 131/70 | Temp 98.0°F | Ht 61.5 in | Wt 192.0 lb

## 2020-10-10 DIAGNOSIS — E119 Type 2 diabetes mellitus without complications: Secondary | ICD-10-CM | POA: Insufficient documentation

## 2020-10-10 DIAGNOSIS — A6920 Lyme disease, unspecified: Secondary | ICD-10-CM | POA: Insufficient documentation

## 2020-10-10 DIAGNOSIS — E782 Mixed hyperlipidemia: Secondary | ICD-10-CM | POA: Insufficient documentation

## 2020-10-10 DIAGNOSIS — Z09 Encounter for follow-up examination after completed treatment for conditions other than malignant neoplasm: Secondary | ICD-10-CM | POA: Insufficient documentation

## 2020-10-10 LAB — MANUAL DIFFERENTIAL
LYMPHOCYTE %: 38 % (ref 24–44)
LYMPHOCYTE ABSOLUTE: 5.81 10*3/uL — ABNORMAL HIGH (ref 1.10–3.80)
MONOCYTE %: 6 % (ref 0–10)
MONOCYTE ABSOLUTE: 0.92 10*3/uL — ABNORMAL HIGH (ref 0.10–0.80)
NEUTROPHIL %: 56 % (ref 36–66)
NEUTROPHIL ABSOLUTE: 8.57 10*3/uL — ABNORMAL HIGH (ref 1.80–7.50)
PLATELET ESTIMATE: ADEQUATE
WBC MORPHOLOGY COMMENT: NORMAL
WBC: 15.3 10*3/uL

## 2020-10-10 LAB — COMPREHENSIVE METABOLIC PNL, FASTING
ALBUMIN/GLOBULIN RATIO: 1.1 — ABNORMAL LOW (ref 1.5–2.5)
ALBUMIN: 4.2 g/dL (ref 3.5–5.0)
ALKALINE PHOSPHATASE: 87 U/L (ref 38–126)
ALT (SGPT): 28 U/L (ref <35)
ANION GAP: 12 mmol/L (ref 5–19)
AST (SGOT): 25 U/L (ref 14–36)
BILIRUBIN TOTAL: 0.6 mg/dL (ref 0.2–1.3)
BUN/CREA RATIO: 24 — ABNORMAL HIGH (ref 6–20)
BUN: 12 mg/dL (ref 7–17)
CALCIUM: 9.3 mg/dL (ref 8.4–10.2)
CHLORIDE: 102 mmol/L (ref 98–107)
CO2 TOTAL: 27 mmol/L (ref 22–30)
CREATININE: 0.49 mg/dL — ABNORMAL LOW (ref 0.52–1.00)
ESTIMATED GFR: >60 mL/min/{1.73_m2} (ref >60)
GLUCOSE: 164 mg/dL — ABNORMAL HIGH (ref 74–106)
POTASSIUM: 4.1 mmol/L (ref 3.5–5.1)
PROTEIN TOTAL: 7.9 g/dL (ref 6.3–8.2)
SODIUM: 141 mmol/L (ref 137–145)

## 2020-10-10 LAB — HGA1C (HEMOGLOBIN A1C WITH EST AVG GLUCOSE)
ESTIMATED AVERAGE GLUCOSE: 163 mg/dL
HEMOGLOBIN A1C: 7.3 % — ABNORMAL HIGH (ref 4.0–6.0)

## 2020-10-10 LAB — CBC WITH DIFF
HCT: 37.9 % (ref 36.0–48.0)
HGB: 12.3 g/dL (ref 11.6–14.8)
MCH: 28.8 pg (ref 24.4–34.0)
MCHC: 32.5 g/dL (ref 30.0–37.0)
MCV: 88.5 fL — ABNORMAL HIGH (ref 79.0–88.0)
MPV: 8.5 fL (ref 7.5–11.5)
PLATELETS: 451 10*3/uL — ABNORMAL HIGH (ref 130–400)
RBC: 4.29 10*6/uL (ref 3.50–5.50)
RDW: 14.4 % — ABNORMAL HIGH (ref 11.5–14.0)
WBC: 15.3 10*3/uL — ABNORMAL HIGH (ref 4.5–11.5)

## 2020-10-10 LAB — LIPID PANEL
CHOLESTEROL: 168 mg/dL (ref 0–200)
HDL CHOL: 32 mg/dL — ABNORMAL LOW (ref >40)
LDL CALC: 103 mg/dL — ABNORMAL HIGH (ref <100)
TRIGLYCERIDES: 166 mg/dL — ABNORMAL HIGH (ref <150)

## 2020-10-10 LAB — MICROALBUMIN URINE, RANDOM: MICROALBUMIN RANDOM URINE: 2.6 mg/dL — ABNORMAL HIGH (ref 0.0–1.7)

## 2020-10-10 MED ORDER — PRAVASTATIN 20 MG TABLET
20.0000 mg | ORAL_TABLET | Freq: Every evening | ORAL | 1 refills | Status: AC
Start: 2020-10-10 — End: ?

## 2020-10-10 MED ORDER — METFORMIN 1,000 MG TABLET
1000.0000 mg | ORAL_TABLET | Freq: Two times a day (BID) | ORAL | 1 refills | Status: AC
Start: 2020-10-10 — End: ?

## 2020-10-10 NOTE — Telephone Encounter (Signed)
error 

## 2020-10-10 NOTE — Progress Notes (Signed)
[facility]  FAMILY MEDICINE, ST. [facility]  [phone] [place]. [place] [address]  [phone]  10/10/2020       Name:  Tammy Pham MRN: [id number]   Date:    10/10/2020 Age:  35 y.o.       Reason for Visit:  Chief Complaint   Patient presents with   . Follow Up     Patient was told to D/c Glipizide at her last visit. She wants to know if she needs to get back on that before she moves. Labs done today at 11 am, but they are still pending.         History of Present Illness:   Tammy Pham is a 35 y.o. female who comes in today who comes in today for lab work follow-up.  Patient had her lab work done today, results discussed with patient.  CMP 164, creatinine 0.49.  A1c 7.3 which is an improvement from 3 months ago.  She is taking metformin a 1000 mg twice a day.  She was also taking glipizide but had frequent hypoglycemia episodes so it was stopped.  She reports no further episodes of hypoglycemia. Microalbumin 2.6.  White blood cell count 15.3, which has also improved.  Cholesterol 168, triglycerides 166, HDL 32, and LDL 103.  Lipids have improved.  Patient states she is moving to [place] this weekend so I will not make any changes in her medications at this time since she will be unable to follow up.  Patient was strongly encouraged to find a new PCP in [place] to follow her diabetes and hyperlipidemia.  She verbalized understanding.  She states she did have a diabetic eye exam on September 12, 2020 which was negative for diabetic retinopathy.  She was scheduled for a Pap smear but not until February.  Encouraged to find a gynecologist in [place].  She requests refills on metformin and pravastatin.  She declines influenza vaccine.  No other complaints or concerns voiced today.    Past Medical History:    Past Medical History:   Diagnosis Date   . Bell's palsy    . COVID-19 06/11/2020   . History of COVID-19    . Lyme disease    . Nasal fracture     . PCOS (polycystic ovarian syndrome)    . Right hand fracture     5th metacarpal fracture      Past Surgical History:  No past surgical history on file.  Family History:    Family Medical History:     Problem Relation (Age of Onset)    Diabetes Mother, Maternal Grandmother           Social History:   Social History     Tobacco Use   . Smoking status: Never Smoker   . Smokeless tobacco: Never Used   Substance Use Topics   . Alcohol use: Yes     Comment: occasional      Social History     Substance and Sexual Activity   Drug Use Never      Social History     Social History Narrative   . Not on file        Allergies:  No Known Allergies   Home Medications:    Outpatient Medications Marked as Taking for the 10/10/20 encounter (Office Visit) with Bertram Savin, CNP   Medication Sig   . MetFORMIN (GLUCOPHAGE) 1,000 mg Oral Tablet Take 1 Tablet (  1,000 mg total) by mouth Twice daily with food   . pravastatin (PRAVACHOL) 20 mg Oral Tablet Take 1 Tablet (20 mg total) by mouth Every evening            Review of Systems:   Review of Systems   Constitutional: Negative for chills, fatigue, fever and unexpected weight change.   HEENT: Negative for congestion, hearing loss, mouth sores, postnasal drip, rhinorrhea, sinus pressure, sinus pain, ear discharge, ear pain, tinnitus, sore throat, trouble swallowing and voice change.    Eyes: Negative for pain, discharge, redness and visual disturbance. Wears glasses.   Respiratory: Negative for cough, chest tightness, and wheezing.  Positive for SOB at times.   Cardiovascular: Negative for chest pain, palpitations and leg swelling.   Gastrointestinal: Negative for abdominal distention, abdominal pain, blood in stool, constipation, diarrhea, nausea and vomiting, reflux or heartburn.  Endocrine: Negative for cold intolerance, heat intolerance, polydipsia, polyphagia and polyuria.   Genitourinary: Negative for dysuria, frequency, hematuria and urgency.   Musculoskeletal: Negative for  arthralgias, back pain, gait problem, myalgias and neck pain.   Skin: Negative for color change, rash and wound.   Neurological: Negative for weakness, numbness or tingling, headaches and memory problems.Positive for occasional dizziness, lightheadedness and shaking when glucose drops.   Psychiatric/Behavioral:  Negative for depression, anxiety, mood swings, sleep disturbances, or suicidal ideations.      All systems have been reviewed and found to be negative except for as stated above in the history of present illness.     Physical Examination:   Vitals: BP 131/70 (Site: Left, Patient Position: Sitting, Cuff Size: Adult)   Temp 36.7 C (98 F) (Thermal Scan)   Ht 1.562 m (5' 1.5")   Wt 87.1 kg (192 lb)   SpO2 99%   BMI 35.69 kg/m        Physical Exam:  General Appearance:  alert, comfortable, cooperative, no acute distress.  Neck/Thyroid:  neck supple, no carotid bruits.   Cardiovascular:  regular rhythm with controlled rate, normal S1 and S2, no murmurs; no lower extremity edema, pedal pulses palpable.  Respiratory:  lungs clear to auscultation AP bilaterally; no wheezes or rhonchi; eupneic at rest.  Gastrointestinal:  abdomen soft, symmetrical, non-tender with bowel sounds auscultated; no masses or guarding upon palpation.  Skin:  flesh tone, warm, dry with good turgor; no rashes or lesions.  Musculoskeletal:  no tenderness or deformity of cervical, thoracic or lumbar spine with good ROM; normal ROM of upper and lower extremities with 5/5 strength.  Neurological:  gait normal; no focal deficits.  Psych:  alert and oriented x 3; normal affect, makes good eye contact, conversation appropriate.      Assessment:   (E11.9) Type 2 diabetes mellitus (CMS HCC)  (primary encounter diagnosis)  Plan: MetFORMIN (GLUCOPHAGE) 1,000 mg Oral Tablet    (E78.2) Mixed hyperlipidemia  Plan: pravastatin (PRAVACHOL) 20 mg Oral Tablet     Plan:   Patient states she is moving to [place] in the few days and is planning to  find a new PCP there.  Patient strongly encouraged to do this and to obtain her records for medical records for her new PCP.  Medications filled as requested.  Encouraged to follow 1800 calorie ADA diet with regular exercise.  Patient verbalized understanding of treatment plan.    Patient was counseled on diagnosis, follow-up, and side effect of medications.  Reasons for follow up in clinic or emergency department were discussed.    Orders:  Orders Placed This Encounter   . MetFORMIN (GLUCOPHAGE) 1,000 mg Oral Tablet   . pravastatin (PRAVACHOL) 20 mg Oral Tablet      Referrals:   Orders Placed This Encounter   No orders of the following type(s) were placed in this encounter: Outpatient Referral.      Discharge Medication List:       Current Discharge Medication List          Accurate as of October 10, 2020  2:55 PM. If you have any questions, ask your nurse or doctor.            CONTINUE these medications - NO CHANGES were made during your visit.      Details   MetFORMIN 1,000 mg Tablet  Commonly known as: GLUCOPHAGE   1,000 mg, Oral, 2 TIMES DAILY WITH FOOD  Qty: 180 Tablet  Refills: 1     pravastatin 20 mg Tablet  Commonly known as: PRAVACHOL   20 mg, Oral, EVERY EVENING  Qty: 90 Tablet  Refills: 1               Follow up: No follow-ups on file. Patient was advised to follow up sooner if issues arise.     Bertram Savin, CNP    This note was partially created using M*Modal fluency direct system (voice recognition software ) and is inherently subject to errors including those of syntax and "sound- alike" substitutions which may escape proofreading.  In such instances, original meaning may be extrapolated by contextual derivation.

## 2021-02-28 ENCOUNTER — Ambulatory Visit (INDEPENDENT_AMBULATORY_CARE_PROVIDER_SITE_OTHER): Payer: Self-pay | Admitting: Family

## 2021-04-11 IMAGING — DX DG HAND COMPLETE 3+V*R*
3 series · 3 of 3 positions shown · non-contrast
Comparison: 10/09/2018

CLINICAL DATA: Fall 1 week ago. Fracture at base of 5th metacarpal,
follow-up

EXAM:
RIGHT HAND - COMPLETE 3+ VIEW

[hand pa]
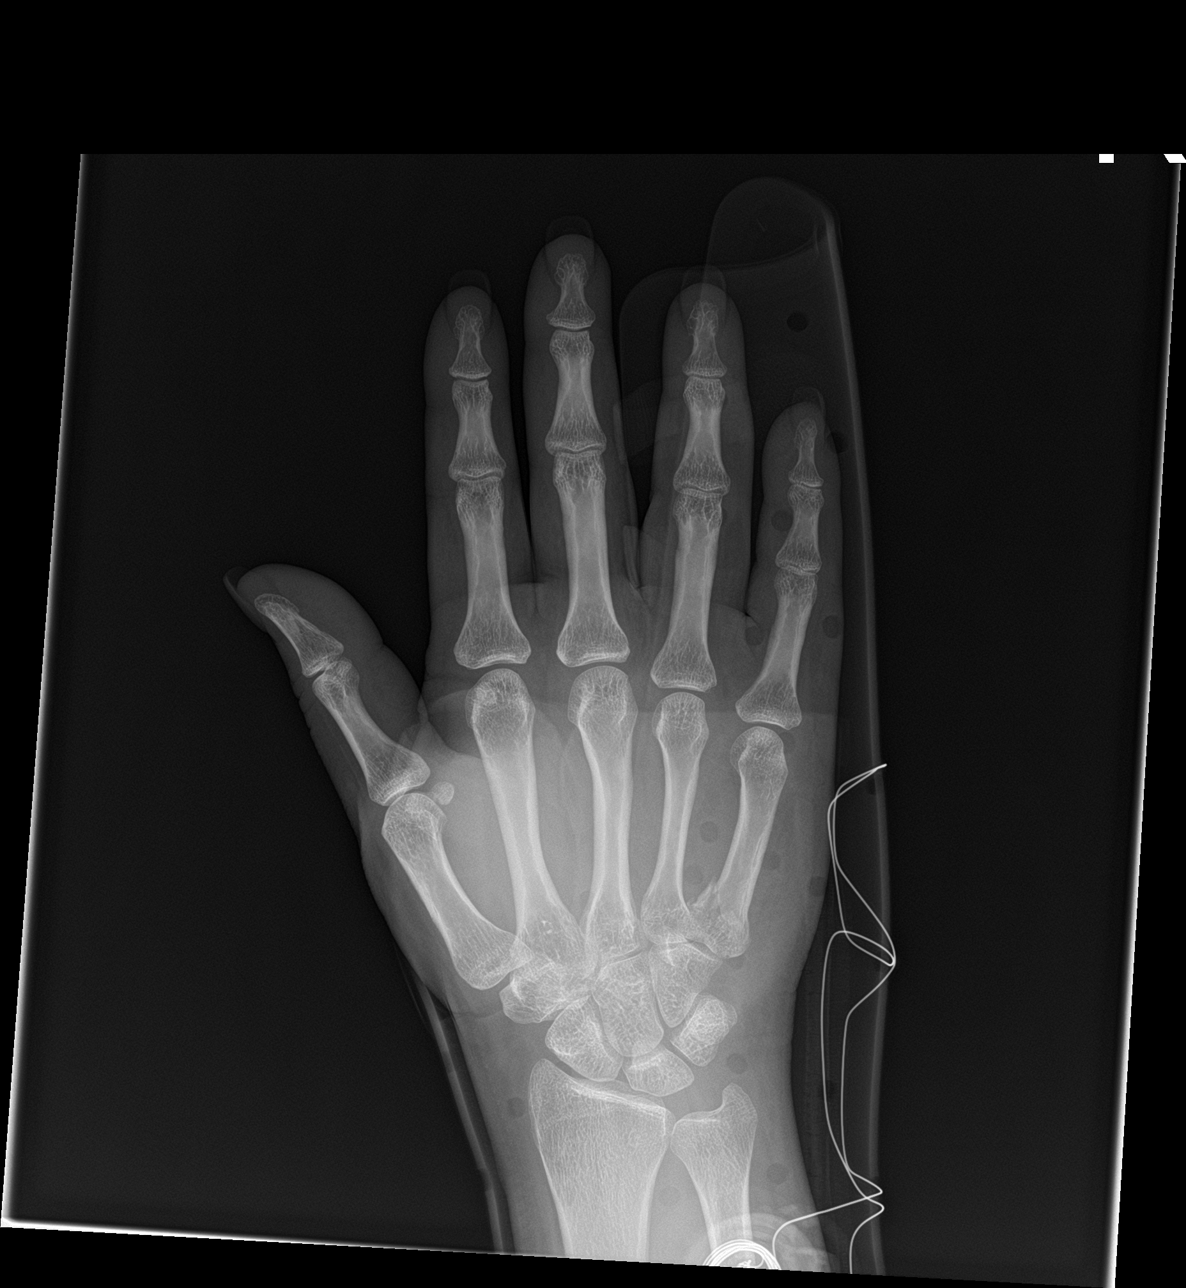

[hand obl]
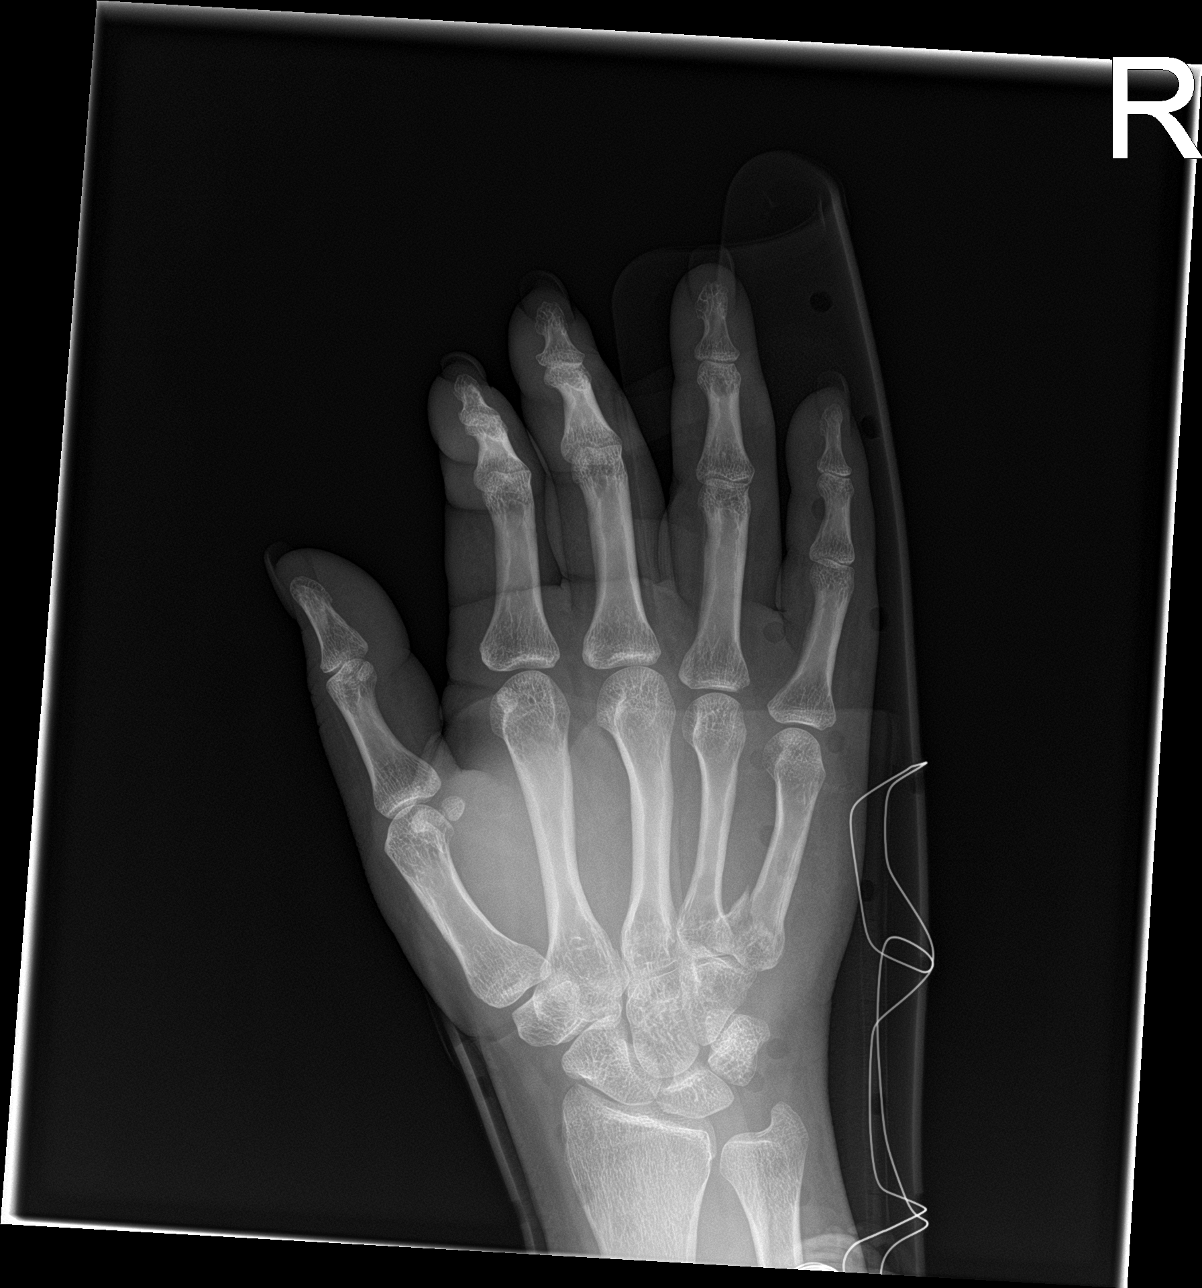

[hand lat]
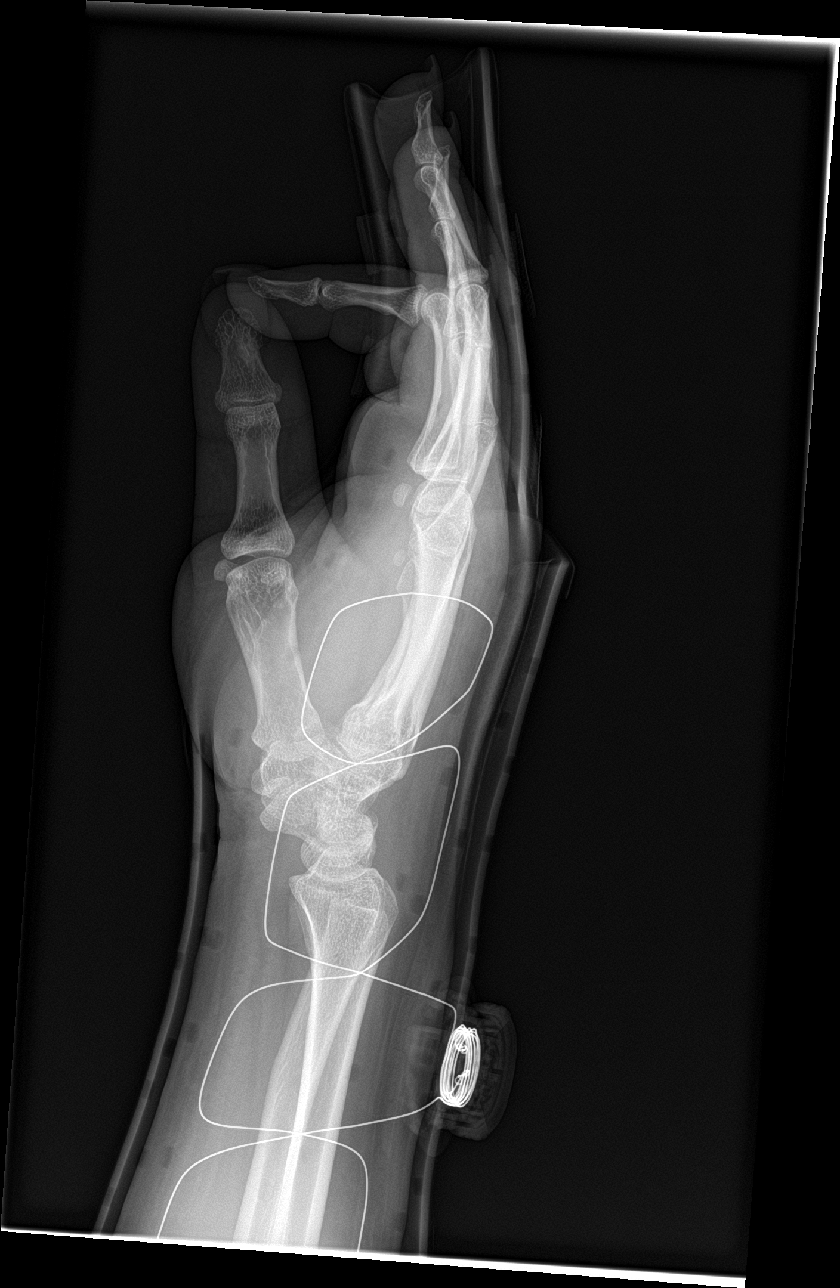

[3 of 3 positions shown; findings below may reference images not displayed]

FINDINGS: Comminuted fracture again noted at the base of the right 5th
metacarpal with minimal displacement. Overall appearance and
alignment is stable. No visible evidence of healing currently. No
additional acute bony abnormality.
IMPRESSION: Stable appearance and alignment of the right 5th metacarpal base
fracture.
